# Patient Record
Sex: Female | Born: 1935 | Race: Black or African American | Hispanic: No | Smoking: Never smoker
Health system: Southern US, Community
[De-identification: ages and names within clinical notes are randomized; demographics above are authoritative.]

## PROBLEM LIST (undated history)

## (undated) DIAGNOSIS — R001 Bradycardia, unspecified: Secondary | ICD-10-CM

## (undated) DIAGNOSIS — M1990 Unspecified osteoarthritis, unspecified site: Secondary | ICD-10-CM

## (undated) DIAGNOSIS — D649 Anemia, unspecified: Secondary | ICD-10-CM

## (undated) DIAGNOSIS — F0390 Unspecified dementia without behavioral disturbance: Secondary | ICD-10-CM

## (undated) DIAGNOSIS — N390 Urinary tract infection, site not specified: Secondary | ICD-10-CM

## (undated) DIAGNOSIS — H409 Unspecified glaucoma: Secondary | ICD-10-CM

## (undated) DIAGNOSIS — Z8673 Personal history of transient ischemic attack (TIA), and cerebral infarction without residual deficits: Secondary | ICD-10-CM

## (undated) DIAGNOSIS — D591 Other autoimmune hemolytic anemias: Secondary | ICD-10-CM

## (undated) DIAGNOSIS — Z8781 Personal history of (healed) traumatic fracture: Secondary | ICD-10-CM

## (undated) DIAGNOSIS — E538 Deficiency of other specified B group vitamins: Secondary | ICD-10-CM

## (undated) HISTORY — PX: SPLENECTOMY, TOTAL: SHX788

---

## 1998-01-08 ENCOUNTER — Other Ambulatory Visit: Admission: RE | Admit: 1998-01-08 | Discharge: 1998-01-08 | Payer: Self-pay | Admitting: Internal Medicine

## 1998-06-02 ENCOUNTER — Other Ambulatory Visit: Admission: RE | Admit: 1998-06-02 | Discharge: 1998-06-02 | Payer: Self-pay | Admitting: Internal Medicine

## 1998-08-14 ENCOUNTER — Ambulatory Visit (HOSPITAL_COMMUNITY): Admission: RE | Admit: 1998-08-14 | Discharge: 1998-08-14 | Payer: Self-pay | Admitting: Gastroenterology

## 1999-08-12 ENCOUNTER — Encounter: Payer: Self-pay | Admitting: Emergency Medicine

## 1999-08-12 ENCOUNTER — Inpatient Hospital Stay (HOSPITAL_COMMUNITY): Admission: EM | Admit: 1999-08-12 | Discharge: 1999-08-14 | Payer: Self-pay | Admitting: Emergency Medicine

## 1999-08-14 ENCOUNTER — Encounter: Payer: Self-pay | Admitting: Internal Medicine

## 2000-03-08 ENCOUNTER — Encounter: Payer: Self-pay | Admitting: Internal Medicine

## 2000-03-08 ENCOUNTER — Ambulatory Visit (HOSPITAL_COMMUNITY): Admission: RE | Admit: 2000-03-08 | Discharge: 2000-03-08 | Payer: Self-pay | Admitting: Internal Medicine

## 2000-03-16 ENCOUNTER — Encounter: Admission: RE | Admit: 2000-03-16 | Discharge: 2000-03-16 | Payer: Self-pay | Admitting: Internal Medicine

## 2000-03-16 ENCOUNTER — Encounter: Payer: Self-pay | Admitting: Internal Medicine

## 2001-10-17 ENCOUNTER — Encounter: Payer: Self-pay | Admitting: Internal Medicine

## 2001-10-17 ENCOUNTER — Encounter: Admission: RE | Admit: 2001-10-17 | Discharge: 2001-10-17 | Payer: Self-pay | Admitting: Internal Medicine

## 2003-01-02 ENCOUNTER — Encounter: Admission: RE | Admit: 2003-01-02 | Discharge: 2003-01-02 | Payer: Self-pay | Admitting: Internal Medicine

## 2003-01-02 ENCOUNTER — Encounter: Payer: Self-pay | Admitting: Internal Medicine

## 2003-03-25 ENCOUNTER — Encounter: Payer: Self-pay | Admitting: Emergency Medicine

## 2003-03-25 ENCOUNTER — Emergency Department (HOSPITAL_COMMUNITY): Admission: EM | Admit: 2003-03-25 | Discharge: 2003-03-26 | Payer: Self-pay | Admitting: Emergency Medicine

## 2003-05-07 ENCOUNTER — Encounter: Admission: RE | Admit: 2003-05-07 | Discharge: 2003-06-19 | Payer: Self-pay | Admitting: Family Medicine

## 2004-07-09 ENCOUNTER — Ambulatory Visit: Payer: Self-pay | Admitting: Hematology & Oncology

## 2004-09-11 ENCOUNTER — Ambulatory Visit: Payer: Self-pay | Admitting: Hematology & Oncology

## 2004-11-12 ENCOUNTER — Ambulatory Visit: Payer: Self-pay | Admitting: Hematology & Oncology

## 2005-01-14 ENCOUNTER — Ambulatory Visit: Payer: Self-pay | Admitting: Hematology & Oncology

## 2005-03-12 ENCOUNTER — Ambulatory Visit: Payer: Self-pay | Admitting: Hematology & Oncology

## 2005-05-13 ENCOUNTER — Ambulatory Visit: Payer: Self-pay | Admitting: Hematology & Oncology

## 2005-06-25 ENCOUNTER — Ambulatory Visit (HOSPITAL_COMMUNITY): Admission: RE | Admit: 2005-06-25 | Discharge: 2005-06-25 | Payer: Self-pay | Admitting: Cardiology

## 2005-07-12 ENCOUNTER — Ambulatory Visit: Payer: Self-pay | Admitting: Hematology & Oncology

## 2005-08-03 ENCOUNTER — Encounter (HOSPITAL_COMMUNITY): Admission: RE | Admit: 2005-08-03 | Discharge: 2005-09-03 | Payer: Self-pay | Admitting: Hematology & Oncology

## 2005-09-13 ENCOUNTER — Ambulatory Visit: Payer: Self-pay | Admitting: Hematology & Oncology

## 2005-11-10 ENCOUNTER — Ambulatory Visit: Payer: Self-pay | Admitting: Hematology & Oncology

## 2005-12-23 LAB — CBC & DIFF AND RETIC
Basophils Absolute: 0 10*3/uL (ref 0.0–0.1)
Eosinophils Absolute: 0.2 10*3/uL (ref 0.0–0.5)
HCT: UNDETERMINED % (ref 34.8–46.6)
HGB: 9.4 g/dL — ABNORMAL LOW (ref 11.6–15.9)
LYMPH%: 20.8 % (ref 14.0–48.0)
MCV: UNDETERMINED fL (ref 81.0–101.0)
MONO%: 6.8 % (ref 0.0–13.0)
NEUT#: 3.8 10*3/uL (ref 1.5–6.5)
Platelets: 265 10*3/uL (ref 145–400)

## 2005-12-23 LAB — RETICULOCYTES (CHCC)
RBC.: 2.62 MIL/uL — ABNORMAL LOW (ref 3.87–5.11)
Retic Ct Pct: 5.1 % — ABNORMAL HIGH (ref 0.4–3.1)

## 2006-01-11 ENCOUNTER — Ambulatory Visit: Payer: Self-pay | Admitting: Hematology & Oncology

## 2006-01-13 LAB — CBC WITH DIFFERENTIAL/PLATELET
BASO%: 1.1 % (ref 0.0–2.0)
Basophils Absolute: 0 10*3/uL (ref 0.0–0.1)
EOS%: 4.2 % (ref 0.0–7.0)
HGB: 10.9 g/dL — ABNORMAL LOW (ref 11.6–15.9)
MCH: UNDETERMINED pg (ref 26.0–34.0)
MCHC: UNDETERMINED g/dL (ref 32.0–36.0)
MCV: UNDETERMINED fL (ref 81.0–101.0)
MONO%: 10 % (ref 0.0–13.0)
RDW: 11.9 % (ref 11.3–14.5)

## 2006-02-03 LAB — CBC WITH DIFFERENTIAL/PLATELET
Basophils Absolute: 0 10*3/uL (ref 0.0–0.1)
Eosinophils Absolute: 0.2 10*3/uL (ref 0.0–0.5)
HGB: 10.6 g/dL — ABNORMAL LOW (ref 11.6–15.9)
MCV: UNDETERMINED fL (ref 81.0–101.0)
NEUT#: 2.2 10*3/uL (ref 1.5–6.5)
RDW: UNDETERMINED % (ref 11.3–14.5)
lymph#: 1.4 10*3/uL (ref 0.9–3.3)

## 2006-02-22 ENCOUNTER — Ambulatory Visit: Payer: Self-pay | Admitting: Hematology & Oncology

## 2006-02-24 LAB — CBC & DIFF AND RETIC
Basophils Absolute: 0.3 10*3/uL — ABNORMAL HIGH (ref 0.0–0.1)
Eosinophils Absolute: 0.2 10*3/uL (ref 0.0–0.5)
HCT: UNDETERMINED % (ref 34.8–46.6)
LYMPH%: 20 % (ref 14.0–48.0)
MCV: UNDETERMINED fL (ref 81.0–101.0)
MONO#: 0.4 10*3/uL (ref 0.1–0.9)
MONO%: 5.2 % (ref 0.0–13.0)
NEUT#: 4.8 10*3/uL (ref 1.5–6.5)
NEUT%: 67.6 % (ref 39.6–76.8)
Platelets: 227 10*3/uL (ref 145–400)
WBC: 7.1 10*3/uL (ref 3.9–10.0)

## 2006-02-24 LAB — COMPREHENSIVE METABOLIC PANEL
AST: 21 U/L (ref 0–37)
Albumin: 3.8 g/dL (ref 3.5–5.2)
BUN: 30 mg/dL — ABNORMAL HIGH (ref 6–23)
CO2: 26 mEq/L (ref 19–32)
Calcium: 8.8 mg/dL (ref 8.4–10.5)
Chloride: 108 mEq/L (ref 96–112)
Creatinine, Ser: 1.26 mg/dL — ABNORMAL HIGH (ref 0.40–1.20)
Glucose, Bld: 166 mg/dL — ABNORMAL HIGH (ref 70–99)
Potassium: 4.1 mEq/L (ref 3.5–5.3)

## 2006-02-24 LAB — RETICULOCYTES (CHCC)
RBC.: 2.99 MIL/uL — ABNORMAL LOW (ref 3.87–5.11)
Retic Ct Pct: 2.2 % (ref 0.4–3.1)

## 2006-02-24 LAB — LACTATE DEHYDROGENASE: LDH: 375 U/L — ABNORMAL HIGH (ref 94–250)

## 2006-03-16 LAB — CBC WITH DIFFERENTIAL/PLATELET
Basophils Absolute: 0 10*3/uL (ref 0.0–0.1)
Eosinophils Absolute: 0.2 10*3/uL (ref 0.0–0.5)
HGB: 10.2 g/dL — ABNORMAL LOW (ref 11.6–15.9)
MCV: UNDETERMINED fL (ref 81.0–101.0)
MONO#: 0.6 10*3/uL (ref 0.1–0.9)
MONO%: 13.9 % — ABNORMAL HIGH (ref 0.0–13.0)
NEUT#: 2.4 10*3/uL (ref 1.5–6.5)
Platelets: 178 10*3/uL (ref 145–400)
RBC: UNDETERMINED 10*6/uL (ref 3.70–5.32)
RDW: UNDETERMINED % (ref 11.3–14.5)
WBC: 4.4 10*3/uL (ref 3.9–10.0)

## 2006-04-05 ENCOUNTER — Ambulatory Visit: Payer: Self-pay | Admitting: Hematology & Oncology

## 2006-04-07 LAB — CBC WITH DIFFERENTIAL/PLATELET
Basophils Absolute: 0.1 10*3/uL (ref 0.0–0.1)
Eosinophils Absolute: 0.1 10*3/uL (ref 0.0–0.5)
HGB: 10.7 g/dL — ABNORMAL LOW (ref 11.6–15.9)
MCV: UNDETERMINED fL (ref 81.0–101.0)
MONO#: 0.3 10*3/uL (ref 0.1–0.9)
MONO%: 6.5 % (ref 0.0–13.0)
NEUT#: 3 10*3/uL (ref 1.5–6.5)
RDW: 14.8 % — ABNORMAL HIGH (ref 11.3–14.5)
WBC: 4.8 10*3/uL (ref 3.9–10.0)
lymph#: 1.3 10*3/uL (ref 0.9–3.3)

## 2006-04-28 LAB — CBC WITH DIFFERENTIAL/PLATELET
Basophils Absolute: 0.1 10*3/uL (ref 0.0–0.1)
Eosinophils Absolute: 0.2 10*3/uL (ref 0.0–0.5)
HCT: UNDETERMINED % (ref 34.8–46.6)
HGB: 11.7 g/dL (ref 11.6–15.9)
LYMPH%: 34.5 % (ref 14.0–48.0)
MCV: UNDETERMINED fL (ref 81.0–101.0)
MONO#: 0.4 10*3/uL (ref 0.1–0.9)
MONO%: 11.5 % (ref 0.0–13.0)
NEUT#: 1.7 10*3/uL (ref 1.5–6.5)
NEUT%: 48 % (ref 39.6–76.8)
Platelets: 172 10*3/uL (ref 145–400)
WBC: 3.5 10*3/uL — ABNORMAL LOW (ref 3.9–10.0)

## 2006-05-19 LAB — CBC WITH DIFFERENTIAL/PLATELET
BASO%: 1.4 % (ref 0.0–2.0)
HCT: 1.4 % — ABNORMAL LOW (ref 34.8–46.6)
LYMPH%: 27.4 % (ref 14.0–48.0)
MCHC: 808 g/dL — ABNORMAL HIGH (ref 32.0–36.0)
MCV: 96.8 fL (ref 81.0–101.0)
MONO%: 9.5 % (ref 0.0–13.0)
NEUT%: 59.2 % (ref 39.6–76.8)
Platelets: 188 10*3/uL (ref 145–400)
RBC: 0.14 10*6/uL — ABNORMAL LOW (ref 3.70–5.32)

## 2006-06-07 ENCOUNTER — Ambulatory Visit: Payer: Self-pay | Admitting: Hematology & Oncology

## 2006-06-30 LAB — CBC WITH DIFFERENTIAL/PLATELET
Basophils Absolute: 0.1 10*3/uL (ref 0.0–0.1)
EOS%: 2 % (ref 0.0–7.0)
Eosinophils Absolute: 0.2 10*3/uL (ref 0.0–0.5)
HGB: 9.5 g/dL — ABNORMAL LOW (ref 11.6–15.9)
MCH: UNDETERMINED pg (ref 26.0–34.0)
NEUT#: 7.4 10*3/uL — ABNORMAL HIGH (ref 1.5–6.5)
RBC: UNDETERMINED 10*6/uL (ref 3.70–5.32)
RDW: UNDETERMINED % (ref 11.3–14.5)
lymph#: 1 10*3/uL (ref 0.9–3.3)

## 2006-07-19 ENCOUNTER — Ambulatory Visit: Payer: Self-pay | Admitting: Hematology & Oncology

## 2006-07-21 LAB — CBC WITH DIFFERENTIAL/PLATELET
BASO%: 1.4 % (ref 0.0–2.0)
EOS%: 3.9 % (ref 0.0–7.0)
MCH: UNDETERMINED pg (ref 26.0–34.0)
MCHC: UNDETERMINED g/dL (ref 32.0–36.0)
RDW: UNDETERMINED % (ref 11.3–14.5)
lymph#: 1 10*3/uL (ref 0.9–3.3)

## 2006-08-11 LAB — CBC WITH DIFFERENTIAL/PLATELET
Basophils Absolute: 0 10*3/uL (ref 0.0–0.1)
Eosinophils Absolute: 0.1 10*3/uL (ref 0.0–0.5)
HGB: 9.4 g/dL — ABNORMAL LOW (ref 11.6–15.9)
MCV: UNDETERMINED fL (ref 81.0–101.0)
MONO%: 3.9 % (ref 0.0–13.0)
NEUT#: 3.7 10*3/uL (ref 1.5–6.5)
RBC: UNDETERMINED 10*6/uL (ref 3.70–5.32)
RDW: UNDETERMINED % (ref 11.3–14.5)
WBC: 5 10*3/uL (ref 3.9–10.0)
lymph#: 1 10*3/uL (ref 0.9–3.3)

## 2006-08-11 LAB — CHCC SMEAR

## 2006-08-29 ENCOUNTER — Ambulatory Visit: Payer: Self-pay | Admitting: Hematology & Oncology

## 2006-09-01 ENCOUNTER — Encounter (HOSPITAL_COMMUNITY): Admission: RE | Admit: 2006-09-01 | Discharge: 2006-11-16 | Payer: Self-pay | Admitting: Hematology & Oncology

## 2006-09-01 LAB — CBC WITH DIFFERENTIAL/PLATELET
BASO%: 1.3 % (ref 0.0–2.0)
Eosinophils Absolute: 0.1 10*3/uL (ref 0.0–0.5)
HCT: UNDETERMINED % (ref 34.8–46.6)
LYMPH%: 17.8 % (ref 14.0–48.0)
MCHC: UNDETERMINED g/dL (ref 32.0–36.0)
MONO#: 0.5 10*3/uL (ref 0.1–0.9)
NEUT#: 4 10*3/uL (ref 1.5–6.5)
NEUT%: 70.1 % (ref 39.6–76.8)
Platelets: 323 10*3/uL (ref 145–400)
RBC: UNDETERMINED 10*6/uL (ref 3.70–5.32)
WBC: 5.7 10*3/uL (ref 3.9–10.0)
lymph#: 1 10*3/uL (ref 0.9–3.3)

## 2006-09-02 LAB — TYPE & CROSSMATCH - CHCC

## 2006-09-29 LAB — CBC WITH DIFFERENTIAL/PLATELET
Eosinophils Absolute: 0.2 10*3/uL (ref 0.0–0.5)
LYMPH%: 21.6 % (ref 14.0–48.0)
MONO#: 0.5 10*3/uL (ref 0.1–0.9)
NEUT#: 4.7 10*3/uL (ref 1.5–6.5)
Platelets: 271 10*3/uL (ref 145–400)
RBC: UNDETERMINED 10*6/uL (ref 3.70–5.32)
RDW: UNDETERMINED % (ref 11.3–14.5)
WBC: 7.1 10*3/uL (ref 3.9–10.0)
lymph#: 1.5 10*3/uL (ref 0.9–3.3)

## 2006-10-17 ENCOUNTER — Ambulatory Visit: Payer: Self-pay | Admitting: Hematology & Oncology

## 2006-10-20 LAB — CBC WITH DIFFERENTIAL/PLATELET
Eosinophils Absolute: 0.2 10*3/uL (ref 0.0–0.5)
HCT: UNDETERMINED % (ref 34.8–46.6)
HGB: 10.1 g/dL — ABNORMAL LOW (ref 11.6–15.9)
LYMPH%: 21.5 % (ref 14.0–48.0)
MONO#: 0.5 10*3/uL (ref 0.1–0.9)
NEUT#: 3.5 10*3/uL (ref 1.5–6.5)
NEUT%: 64.9 % (ref 39.6–76.8)
Platelets: 219 10*3/uL (ref 145–400)
WBC: 5.4 10*3/uL (ref 3.9–10.0)

## 2006-11-09 LAB — CBC WITH DIFFERENTIAL/PLATELET
BASO%: 1.5 % (ref 0.0–2.0)
Basophils Absolute: 0.1 10*3/uL (ref 0.0–0.1)
EOS%: 3.4 % (ref 0.0–7.0)
Eosinophils Absolute: 0.2 10*3/uL (ref 0.0–0.5)
HCT: UNDETERMINED % (ref 34.8–46.6)
HGB: 10.8 g/dL — ABNORMAL LOW (ref 11.6–15.9)
LYMPH%: 22.9 % (ref 14.0–48.0)
MCH: UNDETERMINED pg (ref 26.0–34.0)
MCHC: UNDETERMINED g/dL (ref 32.0–36.0)
MCV: UNDETERMINED fL (ref 81.0–101.0)
MONO#: 0.5 10*3/uL (ref 0.1–0.9)
MONO%: 10.9 % (ref 0.0–13.0)
NEUT#: 3 10*3/uL (ref 1.5–6.5)
NEUT%: 61.3 % (ref 39.6–76.8)
Platelets: 236 10*3/uL (ref 145–400)
RBC: UNDETERMINED 10*6/uL (ref 3.70–5.32)
RDW: UNDETERMINED % (ref 11.3–14.5)
WBC: 5 10*3/uL (ref 3.9–10.0)
lymph#: 1.1 10*3/uL (ref 0.9–3.3)

## 2006-11-29 ENCOUNTER — Ambulatory Visit: Payer: Self-pay | Admitting: Hematology & Oncology

## 2006-12-01 LAB — CBC WITH DIFFERENTIAL/PLATELET
BASO%: 1.2 % (ref 0.0–2.0)
Basophils Absolute: 0.1 10*3/uL (ref 0.0–0.1)
EOS%: 3.4 % (ref 0.0–7.0)
HCT: UNDETERMINED % (ref 34.8–46.6)
MCH: UNDETERMINED pg (ref 26.0–34.0)
MCHC: UNDETERMINED g/dL (ref 32.0–36.0)
MCV: UNDETERMINED fL (ref 81.0–101.0)
MONO%: 10.6 % (ref 0.0–13.0)
NEUT%: 57 % (ref 39.6–76.8)
lymph#: 1.6 10*3/uL (ref 0.9–3.3)

## 2006-12-22 ENCOUNTER — Ambulatory Visit: Payer: Self-pay | Admitting: Hematology & Oncology

## 2006-12-22 LAB — CBC WITH DIFFERENTIAL/PLATELET
BASO%: 1.5 % (ref 0.0–2.0)
Eosinophils Absolute: 0.2 10*3/uL (ref 0.0–0.5)
LYMPH%: 27.5 % (ref 14.0–48.0)
MCHC: UNDETERMINED g/dL (ref 32.0–36.0)
MONO#: 0.6 10*3/uL (ref 0.1–0.9)
NEUT#: 3.2 10*3/uL (ref 1.5–6.5)
Platelets: 223 10*3/uL (ref 145–400)
RBC: UNDETERMINED 10*6/uL (ref 3.70–5.32)
RDW: 15.2 % — ABNORMAL HIGH (ref 11.3–14.5)
WBC: 5.5 10*3/uL (ref 3.9–10.0)
lymph#: 1.5 10*3/uL (ref 0.9–3.3)

## 2007-01-10 ENCOUNTER — Ambulatory Visit: Payer: Self-pay | Admitting: Hematology & Oncology

## 2007-01-12 ENCOUNTER — Ambulatory Visit: Payer: Self-pay | Admitting: Hematology & Oncology

## 2007-01-12 LAB — CBC WITH DIFFERENTIAL/PLATELET
Eosinophils Absolute: 0.2 10*3/uL (ref 0.0–0.5)
HCT: UNDETERMINED % (ref 34.8–46.6)
HGB: 10.5 g/dL — ABNORMAL LOW (ref 11.6–15.9)
LYMPH%: 23 % (ref 14.0–48.0)
MONO#: 0.5 10*3/uL (ref 0.1–0.9)
NEUT#: 2.5 10*3/uL (ref 1.5–6.5)
NEUT%: 60 % (ref 39.6–76.8)
Platelets: 231 10*3/uL (ref 145–400)
WBC: 4.2 10*3/uL (ref 3.9–10.0)
lymph#: 1 10*3/uL (ref 0.9–3.3)

## 2007-02-02 LAB — CBC WITH DIFFERENTIAL/PLATELET
BASO%: 1.5 % (ref 0.0–2.0)
Basophils Absolute: 0.1 10*3/uL (ref 0.0–0.1)
EOS%: 4.3 % (ref 0.0–7.0)
HCT: UNDETERMINED % (ref 34.8–46.6)
HGB: 10.8 g/dL — ABNORMAL LOW (ref 11.6–15.9)
LYMPH%: 29.5 % (ref 14.0–48.0)
MCH: UNDETERMINED pg (ref 26.0–34.0)
MCHC: UNDETERMINED g/dL (ref 32.0–36.0)
MONO#: 0.6 10*3/uL (ref 0.1–0.9)
NEUT%: 53.2 % (ref 39.6–76.8)
Platelets: 225 10*3/uL (ref 145–400)
lymph#: 1.6 10*3/uL (ref 0.9–3.3)

## 2007-02-23 ENCOUNTER — Ambulatory Visit: Payer: Self-pay | Admitting: Hematology & Oncology

## 2007-02-23 LAB — CBC & DIFF AND RETIC
BASO%: 1.7 % (ref 0.0–2.0)
Basophils Absolute: 0.1 10*3/uL (ref 0.0–0.1)
EOS%: 4.4 % (ref 0.0–7.0)
HCT: UNDETERMINED % (ref 34.8–46.6)
HGB: 10.7 g/dL — ABNORMAL LOW (ref 11.6–15.9)
MCH: UNDETERMINED pg (ref 26.0–34.0)
MCHC: UNDETERMINED g/dL (ref 32.0–36.0)
MCV: UNDETERMINED fL (ref 81.0–101.0)
MONO%: 10.2 % (ref 0.0–13.0)
NEUT%: 60.7 % (ref 39.6–76.8)
lymph#: 1.4 10*3/uL (ref 0.9–3.3)

## 2007-02-23 LAB — FERRITIN: Ferritin: 591 ng/mL — ABNORMAL HIGH (ref 10–291)

## 2007-03-12 ENCOUNTER — Emergency Department (HOSPITAL_COMMUNITY): Admission: EM | Admit: 2007-03-12 | Discharge: 2007-03-12 | Payer: Self-pay | Admitting: Emergency Medicine

## 2007-03-16 ENCOUNTER — Ambulatory Visit: Payer: Self-pay | Admitting: Hematology & Oncology

## 2007-03-16 LAB — TECHNOLOGIST REVIEW

## 2007-03-16 LAB — CBC WITH DIFFERENTIAL/PLATELET
BASO%: 1.7 % (ref 0.0–2.0)
EOS%: 3.9 % (ref 0.0–7.0)
HCT: UNDETERMINED % (ref 34.8–46.6)
LYMPH%: 16.4 % (ref 14.0–48.0)
MCH: UNDETERMINED pg (ref 26.0–34.0)
MCHC: UNDETERMINED g/dL (ref 32.0–36.0)
NEUT%: 66.1 % (ref 39.6–76.8)
Platelets: 260 10*3/uL (ref 145–400)
RBC: UNDETERMINED 10*6/uL (ref 3.70–5.32)
lymph#: 0.9 10*3/uL (ref 0.9–3.3)

## 2007-04-03 ENCOUNTER — Ambulatory Visit: Payer: Self-pay | Admitting: Hematology & Oncology

## 2007-04-06 ENCOUNTER — Ambulatory Visit: Payer: Self-pay | Admitting: Hematology & Oncology

## 2007-04-27 LAB — CBC WITH DIFFERENTIAL/PLATELET
BASO%: 0.7 % (ref 0.0–2.0)
EOS%: 3.5 % (ref 0.0–7.0)
MCH: UNDETERMINED pg (ref 26.0–34.0)
MCV: UNDETERMINED fL (ref 81.0–101.0)
MONO%: 8.4 % (ref 0.0–13.0)
RBC: UNDETERMINED 10*6/uL (ref 3.70–5.32)
RDW: UNDETERMINED % (ref 11.3–14.5)

## 2007-05-18 ENCOUNTER — Ambulatory Visit: Payer: Self-pay | Admitting: Hematology & Oncology

## 2007-05-18 LAB — CBC & DIFF AND RETIC
Eosinophils Absolute: 0.2 10*3/uL (ref 0.0–0.5)
MCV: UNDETERMINED fL (ref 81.0–101.0)
MONO#: 0.5 10*3/uL (ref 0.1–0.9)
MONO%: 12.2 % (ref 0.0–13.0)
NEUT#: 2.3 10*3/uL (ref 1.5–6.5)
RBC: UNDETERMINED 10*6/uL (ref 3.70–5.32)
RDW: 15.9 % — ABNORMAL HIGH (ref 11.3–14.5)
RETIC #: 10.6 10*3/uL — ABNORMAL LOW (ref 19.7–115.1)
Retic %: 2.1 % (ref 0.4–2.3)
WBC: 4.4 10*3/uL (ref 3.9–10.0)
lymph#: 1.4 10*3/uL (ref 0.9–3.3)

## 2007-06-05 ENCOUNTER — Ambulatory Visit: Payer: Self-pay | Admitting: Hematology & Oncology

## 2007-06-12 LAB — CBC WITH DIFFERENTIAL/PLATELET
Basophils Absolute: 0.1 10*3/uL (ref 0.0–0.1)
EOS%: 2.4 % (ref 0.0–7.0)
Eosinophils Absolute: 0.1 10*3/uL (ref 0.0–0.5)
HGB: 9.4 g/dL — ABNORMAL LOW (ref 11.6–15.9)
LYMPH%: 22.8 % (ref 14.0–48.0)
MCH: UNDETERMINED pg (ref 26.0–34.0)
MCV: UNDETERMINED fL (ref 81.0–101.0)
MONO%: 8.7 % (ref 0.0–13.0)
NEUT#: 3.4 10*3/uL (ref 1.5–6.5)
NEUT%: 65.1 % (ref 39.6–76.8)
Platelets: 237 10*3/uL (ref 145–400)

## 2007-06-29 LAB — CBC WITH DIFFERENTIAL/PLATELET
EOS%: 2.9 % (ref 0.0–7.0)
Eosinophils Absolute: 0.1 10*3/uL (ref 0.0–0.5)
LYMPH%: 22.9 % (ref 14.0–48.0)
MCH: UNDETERMINED pg (ref 26.0–34.0)
MCHC: UNDETERMINED g/dL (ref 32.0–36.0)
MCV: UNDETERMINED fL (ref 81.0–101.0)
MONO%: 11 % (ref 0.0–13.0)
Platelets: 213 10*3/uL (ref 145–400)
RBC: UNDETERMINED 10*6/uL (ref 3.70–5.32)
RDW: 17.6 % — ABNORMAL HIGH (ref 11.3–14.5)

## 2007-07-18 ENCOUNTER — Ambulatory Visit: Payer: Self-pay | Admitting: Hematology & Oncology

## 2007-07-20 LAB — CBC WITH DIFFERENTIAL/PLATELET
BASO%: 1.2 % (ref 0.0–2.0)
LYMPH%: 27.7 % (ref 14.0–48.0)
MCHC: UNDETERMINED g/dL (ref 32.0–36.0)
MONO#: 0.5 10*3/uL (ref 0.1–0.9)
MONO%: 8 % (ref 0.0–13.0)
NEUT#: 3.8 10*3/uL (ref 1.5–6.5)
Platelets: 278 10*3/uL (ref 145–400)
RBC: UNDETERMINED 10*6/uL (ref 3.70–5.32)
RDW: 15.1 % — ABNORMAL HIGH (ref 11.3–14.5)
WBC: 6.3 10*3/uL (ref 3.9–10.0)

## 2007-07-21 ENCOUNTER — Encounter (HOSPITAL_COMMUNITY): Admission: RE | Admit: 2007-07-21 | Discharge: 2007-09-06 | Payer: Self-pay | Admitting: Hematology & Oncology

## 2007-07-25 LAB — TYPE & CROSSMATCH - CHCC

## 2007-08-10 LAB — CBC WITH DIFFERENTIAL/PLATELET
Basophils Absolute: 0.1 10*3/uL (ref 0.0–0.1)
EOS%: 3.7 % (ref 0.0–7.0)
HCT: UNDETERMINED % (ref 34.8–46.6)
HGB: 11.8 g/dL (ref 11.6–15.9)
LYMPH%: 27.4 % (ref 14.0–48.0)
MCH: UNDETERMINED pg (ref 26.0–34.0)
MCV: UNDETERMINED fL (ref 81.0–101.0)
MONO%: 11.1 % (ref 0.0–13.0)
NEUT%: 56.4 % (ref 39.6–76.8)
RDW: 19.2 % — ABNORMAL HIGH (ref 11.3–14.5)

## 2007-08-28 ENCOUNTER — Ambulatory Visit: Payer: Self-pay | Admitting: Hematology & Oncology

## 2007-09-01 LAB — CBC WITH DIFFERENTIAL/PLATELET
EOS%: 2.1 % (ref 0.0–7.0)
MCH: UNDETERMINED pg (ref 26.0–34.0)
MCHC: UNDETERMINED g/dL (ref 32.0–36.0)
MCV: UNDETERMINED fL (ref 81.0–101.0)
MONO%: 9.2 % (ref 0.0–13.0)
RBC: UNDETERMINED 10*6/uL (ref 3.70–5.32)
RDW: UNDETERMINED % (ref 11.3–14.5)

## 2007-09-21 LAB — CBC WITH DIFFERENTIAL/PLATELET
BASO%: 1.2 % (ref 0.0–2.0)
Eosinophils Absolute: 0.2 10*3/uL (ref 0.0–0.5)
MONO#: 0.6 10*3/uL (ref 0.1–0.9)
MONO%: 11.8 % (ref 0.0–13.0)
NEUT#: 3 10*3/uL (ref 1.5–6.5)
RBC: UNDETERMINED 10*6/uL (ref 3.70–5.32)
RDW: UNDETERMINED % (ref 11.3–14.5)
WBC: 5.1 10*3/uL (ref 3.9–10.0)

## 2007-10-10 ENCOUNTER — Ambulatory Visit: Payer: Self-pay | Admitting: Hematology & Oncology

## 2007-10-12 LAB — CBC WITH DIFFERENTIAL/PLATELET
BASO%: 1.9 % (ref 0.0–2.0)
Basophils Absolute: 0.1 10*3/uL (ref 0.0–0.1)
EOS%: 3.1 % (ref 0.0–7.0)
HCT: UNDETERMINED % (ref 34.8–46.6)
MCH: UNDETERMINED pg (ref 26.0–34.0)
MCHC: UNDETERMINED g/dL (ref 32.0–36.0)
MCV: 94.2 fL (ref 81.0–101.0)
MONO%: 12.2 % (ref 0.0–13.0)
NEUT%: 58.1 % (ref 39.6–76.8)
lymph#: 1.2 10*3/uL (ref 0.9–3.3)

## 2007-11-02 LAB — CBC WITH DIFFERENTIAL/PLATELET
BASO%: 1.1 % (ref 0.0–2.0)
Basophils Absolute: 0.1 10*3/uL (ref 0.0–0.1)
EOS%: 4.2 % (ref 0.0–7.0)
HGB: 9.8 g/dL — ABNORMAL LOW (ref 11.6–15.9)
MCH: UNDETERMINED pg (ref 26.0–34.0)
MCHC: UNDETERMINED g/dL (ref 32.0–36.0)
MCV: UNDETERMINED fL (ref 81.0–101.0)
MONO%: 10.2 % (ref 0.0–13.0)
RBC: UNDETERMINED 10*6/uL (ref 3.70–5.32)
RDW: UNDETERMINED % (ref 11.3–14.5)
lymph#: 1.3 10*3/uL (ref 0.9–3.3)

## 2007-11-21 ENCOUNTER — Ambulatory Visit: Payer: Self-pay | Admitting: Hematology & Oncology

## 2007-11-23 LAB — CBC & DIFF AND RETIC
Basophils Absolute: 0.1 10*3/uL (ref 0.0–0.1)
Eosinophils Absolute: 0.1 10*3/uL (ref 0.0–0.5)
HGB: 10.5 g/dL — ABNORMAL LOW (ref 11.6–15.9)
MCV: UNDETERMINED fL (ref 81.0–101.0)
MONO#: 0.6 10*3/uL (ref 0.1–0.9)
MONO%: 10 % (ref 0.0–13.0)
NEUT#: 3.7 10*3/uL (ref 1.5–6.5)
RDW: 14.4 % (ref 11.3–14.5)
WBC: 5.5 10*3/uL (ref 3.9–10.0)
lymph#: 1.1 10*3/uL (ref 0.9–3.3)

## 2007-11-23 LAB — FERRITIN: Ferritin: 748 ng/mL — ABNORMAL HIGH (ref 10–291)

## 2007-12-14 LAB — CBC WITH DIFFERENTIAL/PLATELET
BASO%: 1 % (ref 0.0–2.0)
MCHC: UNDETERMINED g/dL (ref 32.0–36.0)
MONO#: 0.5 10*3/uL (ref 0.1–0.9)
RBC: UNDETERMINED 10*6/uL (ref 3.70–5.32)
WBC: 4.9 10*3/uL (ref 3.9–10.0)
lymph#: 1.1 10*3/uL (ref 0.9–3.3)

## 2008-01-01 ENCOUNTER — Ambulatory Visit: Payer: Self-pay | Admitting: Hematology & Oncology

## 2008-01-04 LAB — CBC WITH DIFFERENTIAL/PLATELET
BASO%: 1.1 % (ref 0.0–2.0)
Basophils Absolute: 0.1 10*3/uL (ref 0.0–0.1)
HCT: UNDETERMINED % (ref 34.8–46.6)
HGB: 8.9 g/dL — ABNORMAL LOW (ref 11.6–15.9)
MCHC: UNDETERMINED g/dL (ref 32.0–36.0)
MONO#: 0.7 10*3/uL (ref 0.1–0.9)
NEUT%: 69.1 % (ref 39.6–76.8)
WBC: 6.6 10*3/uL (ref 3.9–10.0)
lymph#: 1 10*3/uL (ref 0.9–3.3)

## 2008-01-25 ENCOUNTER — Encounter (HOSPITAL_COMMUNITY): Admission: RE | Admit: 2008-01-25 | Discharge: 2008-04-15 | Payer: Self-pay | Admitting: Hematology & Oncology

## 2008-02-20 ENCOUNTER — Ambulatory Visit: Payer: Self-pay | Admitting: Hematology & Oncology

## 2008-02-22 LAB — CBC WITH DIFFERENTIAL/PLATELET
BASO%: 1 % (ref 0.0–2.0)
Basophils Absolute: 0.1 10*3/uL (ref 0.0–0.1)
HCT: UNDETERMINED % (ref 34.8–46.6)
HGB: 11.8 g/dL (ref 11.6–15.9)
MCHC: UNDETERMINED g/dL (ref 32.0–36.0)
MONO#: 0.6 10*3/uL (ref 0.1–0.9)
NEUT%: 61 % (ref 39.6–76.8)
WBC: 5.6 10*3/uL (ref 3.9–10.0)
lymph#: 1.3 10*3/uL (ref 0.9–3.3)

## 2008-02-22 LAB — CHCC SMEAR

## 2008-03-07 LAB — CBC WITH DIFFERENTIAL/PLATELET
BASO%: 0.6 % (ref 0.0–2.0)
EOS%: 2.6 % (ref 0.0–7.0)
Eosinophils Absolute: 0.2 10*3/uL (ref 0.0–0.5)
MCH: UNDETERMINED pg (ref 26.0–34.0)
MCV: UNDETERMINED fL (ref 81.0–101.0)
MONO%: 5.7 % (ref 0.0–13.0)
NEUT#: 4.6 10*3/uL (ref 1.5–6.5)
RBC: UNDETERMINED 10*6/uL (ref 3.70–5.32)
RDW: UNDETERMINED % (ref 11.3–14.5)

## 2008-03-25 ENCOUNTER — Ambulatory Visit: Payer: Self-pay | Admitting: Hematology

## 2008-03-28 LAB — CBC WITH DIFFERENTIAL/PLATELET
Eosinophils Absolute: 0.1 10*3/uL (ref 0.0–0.5)
MONO#: 0.5 10*3/uL (ref 0.1–0.9)
NEUT#: 2.5 10*3/uL (ref 1.5–6.5)
RBC: UNDETERMINED 10*6/uL (ref 3.70–5.32)
RDW: 15.4 % — ABNORMAL HIGH (ref 11.3–14.5)
WBC: 4.5 10*3/uL (ref 3.9–10.0)

## 2008-04-15 LAB — CBC WITH DIFFERENTIAL/PLATELET
Basophils Absolute: 0.1 10*3/uL (ref 0.0–0.1)
EOS%: 2.8 % (ref 0.0–7.0)
HGB: 11.3 g/dL — ABNORMAL LOW (ref 11.6–15.9)
MCH: UNDETERMINED pg (ref 26.0–34.0)
MCV: UNDETERMINED fL (ref 81.0–101.0)
MONO%: 7.3 % (ref 0.0–13.0)
RDW: 18 % — ABNORMAL HIGH (ref 11.3–14.5)

## 2008-05-15 ENCOUNTER — Ambulatory Visit: Payer: Self-pay | Admitting: Hematology & Oncology

## 2008-05-16 LAB — CBC WITH DIFFERENTIAL (CANCER CENTER ONLY)
BASO#: 0 10*3/uL (ref 0.0–0.2)
EOS%: 5.2 % (ref 0.0–7.0)
Eosinophils Absolute: 0.2 10*3/uL (ref 0.0–0.5)
HCT: 26 % — ABNORMAL LOW (ref 34.8–46.6)
HGB: 9.2 g/dL — ABNORMAL LOW (ref 11.6–15.9)
LYMPH%: 30.3 % (ref 14.0–48.0)
MCH: 33.2 pg (ref 26.0–34.0)
MCHC: 35.4 g/dL (ref 32.0–36.0)
MCV: 94 fL (ref 81–101)
MONO%: 5.2 % (ref 0.0–13.0)
NEUT#: 2.5 10*3/uL (ref 1.5–6.5)
NEUT%: 58.9 % (ref 39.6–80.0)
RBC: 2.77 10*6/uL — ABNORMAL LOW (ref 3.70–5.32)

## 2008-06-13 ENCOUNTER — Ambulatory Visit: Payer: Self-pay | Admitting: Hematology

## 2008-06-13 LAB — CBC WITH DIFFERENTIAL/PLATELET
BASO%: 1 % (ref 0.0–2.0)
Eosinophils Absolute: 0.1 10*3/uL (ref 0.0–0.5)
LYMPH%: 21.7 % (ref 14.0–48.0)
MCHC: UNDETERMINED g/dL (ref 32.0–36.0)
MCV: UNDETERMINED fL (ref 81.0–101.0)
MONO#: 0.5 10*3/uL (ref 0.1–0.9)
MONO%: 10.3 % (ref 0.0–13.0)
NEUT#: 3.3 10*3/uL (ref 1.5–6.5)
RBC: UNDETERMINED 10*6/uL (ref 3.70–5.32)
RDW: 14.5 % (ref 11.3–14.5)
WBC: 5.2 10*3/uL (ref 3.9–10.0)

## 2008-06-13 LAB — HOLD TUBE, BLOOD BANK

## 2008-07-17 ENCOUNTER — Ambulatory Visit: Payer: Self-pay | Admitting: Hematology & Oncology

## 2008-07-18 LAB — CBC WITH DIFFERENTIAL (CANCER CENTER ONLY)
BASO#: 0 10*3/uL (ref 0.0–0.2)
EOS%: 5.9 % (ref 0.0–7.0)
Eosinophils Absolute: 0.2 10*3/uL (ref 0.0–0.5)
HCT: 25 % — ABNORMAL LOW (ref 34.8–46.6)
HGB: 8.9 g/dL — ABNORMAL LOW (ref 11.6–15.9)
LYMPH%: 28.2 % (ref 14.0–48.0)
MCH: 35.1 pg — ABNORMAL HIGH (ref 26.0–34.0)
MCHC: 35.7 g/dL (ref 32.0–36.0)
MCV: 98 fL (ref 81–101)
MONO%: 7.7 % (ref 0.0–13.0)
NEUT%: 57.8 % (ref 39.6–80.0)
RBC: 2.55 10*6/uL — ABNORMAL LOW (ref 3.70–5.32)

## 2008-07-23 LAB — RETICULOCYTES (CHCC)
ABS Retic: 67.2 10*3/uL (ref 19.0–186.0)
RBC.: 2.49 MIL/uL — ABNORMAL LOW (ref 3.87–5.11)

## 2008-07-23 LAB — FERRITIN: Ferritin: 832 ng/mL — ABNORMAL HIGH (ref 10–291)

## 2008-07-23 LAB — LACTATE DEHYDROGENASE: LDH: 407 U/L — ABNORMAL HIGH (ref 94–250)

## 2008-08-06 ENCOUNTER — Ambulatory Visit: Payer: Self-pay | Admitting: Hematology

## 2008-08-12 LAB — CBC WITH DIFFERENTIAL/PLATELET
Basophils Absolute: 0.1 10*3/uL (ref 0.0–0.1)
Eosinophils Absolute: 0.2 10*3/uL (ref 0.0–0.5)
HGB: 10.9 g/dL — ABNORMAL LOW (ref 11.6–15.9)
NEUT#: 2.9 10*3/uL (ref 1.5–6.5)
RDW: 13.1 % (ref 11.3–14.5)
lymph#: 1.1 10*3/uL (ref 0.9–3.3)

## 2008-08-29 LAB — CBC WITH DIFFERENTIAL/PLATELET
Basophils Absolute: 0.1 10*3/uL (ref 0.0–0.1)
Eosinophils Absolute: 0.2 10*3/uL (ref 0.0–0.5)
HCT: UNDETERMINED % (ref 34.8–46.6)
HGB: 10.4 g/dL — ABNORMAL LOW (ref 11.6–15.9)
LYMPH%: 26.1 % (ref 14.0–48.0)
MCV: UNDETERMINED fL (ref 81.0–101.0)
MONO%: 12.6 % (ref 0.0–13.0)
NEUT#: 2.5 10*3/uL (ref 1.5–6.5)
Platelets: 215 10*3/uL (ref 145–400)

## 2008-08-29 LAB — TECHNOLOGIST REVIEW

## 2008-09-18 ENCOUNTER — Ambulatory Visit: Payer: Self-pay | Admitting: Hematology & Oncology

## 2008-10-02 LAB — CBC WITH DIFFERENTIAL (CANCER CENTER ONLY)
BASO#: 0 10*3/uL (ref 0.0–0.2)
EOS%: 3.9 % (ref 0.0–7.0)
Eosinophils Absolute: 0.2 10*3/uL (ref 0.0–0.5)
HCT: 23.1 % — ABNORMAL LOW (ref 34.8–46.6)
HGB: 8.1 g/dL — ABNORMAL LOW (ref 11.6–15.9)
LYMPH#: 1.4 10*3/uL (ref 0.9–3.3)
MCHC: 35.1 g/dL (ref 32.0–36.0)
NEUT%: 56.6 % (ref 39.6–80.0)
RBC: 2.36 10*6/uL — ABNORMAL LOW (ref 3.70–5.32)

## 2008-10-07 LAB — RETICULOCYTES (CHCC)

## 2008-10-23 LAB — CBC WITH DIFFERENTIAL (CANCER CENTER ONLY)
BASO#: 0 10*3/uL (ref 0.0–0.2)
BASO%: 0.5 % (ref 0.0–2.0)
HCT: 31.6 % — ABNORMAL LOW (ref 34.8–46.6)
HGB: 10.6 g/dL — ABNORMAL LOW (ref 11.6–15.9)
LYMPH#: 0.9 10*3/uL (ref 0.9–3.3)
MONO#: 0.2 10*3/uL (ref 0.1–0.9)
NEUT#: 2.8 10*3/uL (ref 1.5–6.5)
NEUT%: 68.5 % (ref 39.6–80.0)
RDW: 10.4 % — ABNORMAL LOW (ref 10.5–14.6)
WBC: 4.2 10*3/uL (ref 3.9–10.0)

## 2008-10-23 LAB — RETICULOCYTES (CHCC)
ABS Retic: 69.1 10*3/uL (ref 19.0–186.0)
RBC.: 3.14 MIL/uL — ABNORMAL LOW (ref 3.87–5.11)

## 2008-10-24 ENCOUNTER — Inpatient Hospital Stay (HOSPITAL_COMMUNITY): Admission: EM | Admit: 2008-10-24 | Discharge: 2008-10-25 | Payer: Self-pay | Admitting: Emergency Medicine

## 2008-11-11 ENCOUNTER — Ambulatory Visit: Payer: Self-pay | Admitting: Hematology

## 2008-11-13 ENCOUNTER — Encounter (HOSPITAL_COMMUNITY): Admission: RE | Admit: 2008-11-13 | Discharge: 2009-01-09 | Payer: Self-pay | Admitting: Hematology & Oncology

## 2008-11-13 LAB — CBC WITH DIFFERENTIAL/PLATELET
BASO%: 0.4 % (ref 0.0–2.0)
Eosinophils Absolute: 0.2 10*3/uL (ref 0.0–0.5)
HCT: UNDETERMINED % (ref 34.8–46.6)
LYMPH%: 31.6 % (ref 14.0–49.7)
MCHC: UNDETERMINED g/dL (ref 31.5–36.0)
MCV: UNDETERMINED fL (ref 79.5–101.0)
MONO#: 0.5 10*3/uL (ref 0.1–0.9)
MONO%: 10.2 % (ref 0.0–14.0)
NEUT%: 54.5 % (ref 38.4–76.8)
Platelets: 236 10*3/uL (ref 145–400)
RBC: UNDETERMINED 10*6/uL (ref 3.70–5.45)
nRBC: 0 % (ref 0–0)

## 2008-12-04 LAB — CBC WITH DIFFERENTIAL/PLATELET
Basophils Absolute: 0 10*3/uL (ref 0.0–0.1)
EOS%: 5 % (ref 0.0–7.0)
Eosinophils Absolute: 0.3 10*3/uL (ref 0.0–0.5)
HGB: 9.8 g/dL — ABNORMAL LOW (ref 11.6–15.9)
LYMPH%: 22.1 % (ref 14.0–49.7)
MCH: 34 pg (ref 25.1–34.0)
MCV: 100.3 fL (ref 79.5–101.0)
MONO%: 13.7 % (ref 0.0–14.0)
NEUT#: 3.1 10*3/uL (ref 1.5–6.5)
Platelets: 207 10*3/uL (ref 145–400)
RDW: 14.7 % — ABNORMAL HIGH (ref 11.2–14.5)

## 2008-12-24 ENCOUNTER — Ambulatory Visit: Payer: Self-pay | Admitting: Hematology & Oncology

## 2008-12-25 LAB — CBC WITH DIFFERENTIAL (CANCER CENTER ONLY)
BASO%: 0.3 % (ref 0.0–2.0)
LYMPH#: 1.3 10*3/uL (ref 0.9–3.3)
MONO#: 0.3 10*3/uL (ref 0.1–0.9)
Platelets: 210 10*3/uL (ref 145–400)
RDW: 11.4 % (ref 10.5–14.6)
WBC: 4.1 10*3/uL (ref 3.9–10.0)

## 2008-12-27 LAB — RETICULOCYTES (CHCC)

## 2009-01-13 ENCOUNTER — Ambulatory Visit: Payer: Self-pay | Admitting: Hematology

## 2009-01-15 ENCOUNTER — Encounter (HOSPITAL_COMMUNITY): Admission: RE | Admit: 2009-01-15 | Discharge: 2009-03-05 | Payer: Self-pay | Admitting: Oncology

## 2009-01-15 LAB — CBC WITH DIFFERENTIAL/PLATELET
EOS%: 3.2 % (ref 0.0–7.0)
MCH: UNDETERMINED pg (ref 25.1–34.0)
MCV: UNDETERMINED fL (ref 79.5–101.0)
MONO%: 9.6 % (ref 0.0–14.0)
NEUT#: 3.4 10*3/uL (ref 1.5–6.5)
RBC: UNDETERMINED 10*6/uL (ref 3.70–5.45)
RDW: UNDETERMINED % (ref 11.2–14.5)

## 2009-01-16 LAB — TYPE & CROSSMATCH - CHCC

## 2009-02-04 LAB — CHCC SATELLITE - SMEAR

## 2009-02-04 LAB — CBC WITH DIFFERENTIAL (CANCER CENTER ONLY)
BASO%: 0.2 % (ref 0.0–2.0)
EOS%: 4.3 % (ref 0.0–7.0)
LYMPH%: 28.9 % (ref 14.0–48.0)
MCH: 34.6 pg — ABNORMAL HIGH (ref 26.0–34.0)
MCV: 100 fL (ref 81–101)
MONO%: 5.9 % (ref 0.0–13.0)
Platelets: 245 10*3/uL (ref 145–400)
RDW: 12.6 % (ref 10.5–14.6)
WBC: 4.1 10*3/uL (ref 3.9–10.0)

## 2009-02-09 LAB — RETICULOCYTES (CHCC)

## 2009-02-09 LAB — FERRITIN: Ferritin: 1270 ng/mL — ABNORMAL HIGH (ref 10–291)

## 2009-03-05 ENCOUNTER — Ambulatory Visit: Payer: Self-pay | Admitting: Hematology

## 2009-03-11 ENCOUNTER — Encounter (HOSPITAL_COMMUNITY): Admission: RE | Admit: 2009-03-11 | Discharge: 2009-06-05 | Payer: Self-pay | Admitting: Hematology & Oncology

## 2009-03-11 LAB — CBC WITH DIFFERENTIAL/PLATELET
Basophils Absolute: 0 10*3/uL (ref 0.0–0.1)
Eosinophils Absolute: 0.2 10*3/uL (ref 0.0–0.5)
HGB: 8.9 g/dL — ABNORMAL LOW (ref 11.6–15.9)
MONO#: 0.5 10*3/uL (ref 0.1–0.9)
NEUT#: 3.7 10*3/uL (ref 1.5–6.5)
RBC: UNDETERMINED 10*6/uL (ref 3.70–5.45)
RDW: UNDETERMINED % (ref 11.2–14.5)
WBC: 5.8 10*3/uL (ref 3.9–10.3)
nRBC: 0 % (ref 0–0)

## 2009-03-12 LAB — TYPE & CROSSMATCH - CHCC

## 2009-04-14 ENCOUNTER — Ambulatory Visit: Payer: Self-pay | Admitting: Hematology & Oncology

## 2009-04-21 LAB — CBC WITH DIFFERENTIAL (CANCER CENTER ONLY)
HCT: 26.9 % — ABNORMAL LOW (ref 34.8–46.6)
HGB: 9.6 g/dL — ABNORMAL LOW (ref 11.6–15.9)
MCH: 32.9 pg (ref 26.0–34.0)
MCHC: 35.7 g/dL (ref 32.0–36.0)
MCV: 92 fL (ref 81–101)
RDW: 12.4 % (ref 10.5–14.6)

## 2009-04-21 LAB — MANUAL DIFFERENTIAL (CHCC SATELLITE)
ALC: 1.1 10*3/uL (ref 0.6–2.2)
BASO: 2 % (ref 0–2)
Eos: 5 % (ref 0–7)
MONO: 3 % (ref 0–13)
PLT EST ~~LOC~~: ADEQUATE
SEG: 70 % (ref 40–75)

## 2009-04-23 LAB — RETICULOCYTES (CHCC)

## 2009-05-08 ENCOUNTER — Ambulatory Visit: Payer: Self-pay | Admitting: Hematology

## 2009-05-13 LAB — CBC WITH DIFFERENTIAL/PLATELET
Basophils Absolute: 0.2 10*3/uL — ABNORMAL HIGH (ref 0.0–0.1)
HCT: UNDETERMINED % (ref 34.8–46.6)
HGB: 10.2 g/dL — ABNORMAL LOW (ref 11.6–15.9)
LYMPH%: 31 % (ref 14.0–49.7)
MCH: UNDETERMINED pg (ref 25.1–34.0)
MONO#: 0.6 10*3/uL (ref 0.1–0.9)
NEUT%: 50.8 % (ref 38.4–76.8)
Platelets: 178 10*3/uL (ref 145–400)
WBC: 5.2 10*3/uL (ref 3.9–10.3)
lymph#: 1.6 10*3/uL (ref 0.9–3.3)

## 2009-06-05 ENCOUNTER — Ambulatory Visit: Payer: Self-pay | Admitting: Hematology & Oncology

## 2009-06-06 LAB — CBC WITH DIFFERENTIAL (CANCER CENTER ONLY)
BASO#: 0 10*3/uL (ref 0.0–0.2)
Eosinophils Absolute: 0.2 10*3/uL (ref 0.0–0.5)
HCT: 31.3 % — ABNORMAL LOW (ref 34.8–46.6)
HGB: 10.9 g/dL — ABNORMAL LOW (ref 11.6–15.9)
LYMPH#: 1.2 10*3/uL (ref 0.9–3.3)
LYMPH%: 27.3 % (ref 14.0–48.0)
MCV: 99 fL (ref 81–101)
MONO#: 0.3 10*3/uL (ref 0.1–0.9)
NEUT%: 61.1 % (ref 39.6–80.0)
RBC: 3.17 10*6/uL — ABNORMAL LOW (ref 3.70–5.32)
RDW: 11.2 % (ref 10.5–14.6)
WBC: 4.3 10*3/uL (ref 3.9–10.0)

## 2009-06-06 LAB — HOLD TUBE, BLOOD BANK - CHCC SATELLITE

## 2009-06-06 LAB — CHCC SATELLITE - SMEAR

## 2009-06-09 LAB — RETICULOCYTES (CHCC)

## 2009-06-20 ENCOUNTER — Ambulatory Visit: Payer: Self-pay | Admitting: Hematology

## 2009-06-26 ENCOUNTER — Encounter (HOSPITAL_COMMUNITY): Admission: RE | Admit: 2009-06-26 | Discharge: 2009-09-04 | Payer: Self-pay | Admitting: Hematology & Oncology

## 2009-06-26 LAB — CBC WITH DIFFERENTIAL/PLATELET
Basophils Absolute: 0 10*3/uL (ref 0.0–0.1)
Eosinophils Absolute: 0.2 10*3/uL (ref 0.0–0.5)
HCT: UNDETERMINED % (ref 34.8–46.6)
HGB: 7.2 g/dL — ABNORMAL LOW (ref 11.6–15.9)
MCH: UNDETERMINED pg (ref 25.1–34.0)
MCV: UNDETERMINED fL (ref 79.5–101.0)
MONO%: 10.5 % (ref 0.0–14.0)
NEUT#: 3 10*3/uL (ref 1.5–6.5)
NEUT%: 61.6 % (ref 38.4–76.8)
RDW: UNDETERMINED % (ref 11.2–14.5)

## 2009-06-30 LAB — TYPE & CROSSMATCH - CHCC

## 2009-07-14 ENCOUNTER — Ambulatory Visit: Payer: Self-pay | Admitting: Hematology & Oncology

## 2009-07-18 LAB — CBC WITH DIFFERENTIAL (CANCER CENTER ONLY)
Eosinophils Absolute: 0.2 10*3/uL (ref 0.0–0.5)
HCT: 30.2 % — ABNORMAL LOW (ref 34.8–46.6)
LYMPH#: 1.3 10*3/uL (ref 0.9–3.3)
LYMPH%: 29 % (ref 14.0–48.0)
MCV: 95 fL (ref 81–101)
MONO#: 0.4 10*3/uL (ref 0.1–0.9)
Platelets: 238 10*3/uL (ref 145–400)
RBC: 3.16 10*6/uL — ABNORMAL LOW (ref 3.70–5.32)
WBC: 4.4 10*3/uL (ref 3.9–10.0)

## 2009-07-23 LAB — COLD AGGLUTININ TITER: Cold Agglutinin Titer: 1:20480 {titer} — AB

## 2009-07-23 LAB — RETICULOCYTES (CHCC)

## 2009-08-13 ENCOUNTER — Ambulatory Visit: Payer: Self-pay | Admitting: Hematology

## 2009-08-15 LAB — CBC WITH DIFFERENTIAL/PLATELET
BASO%: 0.7 % (ref 0.0–2.0)
EOS%: 6.4 % (ref 0.0–7.0)
Eosinophils Absolute: 0.3 10*3/uL (ref 0.0–0.5)
LYMPH%: 29.9 % (ref 14.0–49.7)
MCH: UNDETERMINED pg (ref 25.1–34.0)
MCHC: UNDETERMINED g/dL (ref 31.5–36.0)
MCV: UNDETERMINED fL (ref 79.5–101.0)
MONO%: 8.4 % (ref 0.0–14.0)
NEUT#: 2.4 10*3/uL (ref 1.5–6.5)
Platelets: 188 10*3/uL (ref 145–400)
RBC: UNDETERMINED 10*6/uL (ref 3.70–5.45)
RDW: UNDETERMINED % (ref 11.2–14.5)
nRBC: 0 % (ref 0–0)

## 2009-08-15 LAB — HOLD TUBE, BLOOD BANK

## 2009-08-18 LAB — RETICULOCYTES (CHCC)

## 2009-09-17 ENCOUNTER — Ambulatory Visit: Payer: Self-pay | Admitting: Hematology

## 2009-09-19 ENCOUNTER — Encounter (HOSPITAL_COMMUNITY): Admission: RE | Admit: 2009-09-19 | Discharge: 2009-12-18 | Payer: Self-pay | Admitting: Hematology & Oncology

## 2009-09-19 LAB — CBC & DIFF AND RETIC
BASO%: 0.5 % (ref 0.0–2.0)
Basophils Absolute: 0 10*3/uL (ref 0.0–0.1)
HCT: UNDETERMINED % (ref 34.8–46.6)
HGB: 8.3 g/dL — ABNORMAL LOW (ref 11.6–15.9)
LYMPH%: 20.7 % (ref 14.0–49.7)
MCHC: UNDETERMINED g/dL (ref 31.5–36.0)
MONO#: 0.6 10*3/uL (ref 0.1–0.9)
NEUT%: 64.6 % (ref 38.4–76.8)
Platelets: 194 10*3/uL (ref 145–400)
WBC: 5.6 10*3/uL (ref 3.9–10.3)

## 2009-09-22 LAB — RETICULOCYTES (CHCC)

## 2009-09-22 LAB — TYPE & CROSSMATCH - CHCC

## 2009-10-21 ENCOUNTER — Ambulatory Visit: Payer: Self-pay | Admitting: Hematology & Oncology

## 2009-10-23 LAB — CBC WITH DIFFERENTIAL (CANCER CENTER ONLY)
BASO#: 0 10*3/uL (ref 0.0–0.2)
EOS%: 6 % (ref 0.0–7.0)
Eosinophils Absolute: 0.3 10*3/uL (ref 0.0–0.5)
HGB: 8.1 g/dL — ABNORMAL LOW (ref 11.6–15.9)
LYMPH#: 0.8 10*3/uL — ABNORMAL LOW (ref 0.9–3.3)
NEUT#: 3.1 10*3/uL (ref 1.5–6.5)
RBC: 2.36 10*6/uL — ABNORMAL LOW (ref 3.70–5.32)
WBC: 4.5 10*3/uL (ref 3.9–10.0)

## 2009-10-24 ENCOUNTER — Ambulatory Visit: Payer: Self-pay | Admitting: Oncology

## 2009-10-24 LAB — TYPE & CROSSMATCH - CHCC SATELLITE

## 2009-11-13 LAB — MANUAL DIFFERENTIAL (CHCC SATELLITE)
ALC: 1.4 10*3/uL (ref 0.6–2.2)
SEG: 60 % (ref 40–75)

## 2009-11-13 LAB — CBC WITH DIFFERENTIAL (CANCER CENTER ONLY)
MCV: 97 fL (ref 81–101)
Platelets: ADEQUATE 10*3/uL (ref 145–400)
RBC: 3.58 10*6/uL — ABNORMAL LOW (ref 3.70–5.32)
WBC: 5.5 10*3/uL (ref 3.9–10.0)

## 2009-11-13 LAB — HOLD TUBE, BLOOD BANK - CHCC SATELLITE

## 2009-11-13 LAB — CHCC SATELLITE - SMEAR

## 2009-12-01 ENCOUNTER — Ambulatory Visit: Payer: Self-pay | Admitting: Oncology

## 2009-12-16 ENCOUNTER — Ambulatory Visit: Payer: Self-pay | Admitting: Hematology & Oncology

## 2009-12-17 LAB — CBC WITH DIFFERENTIAL (CANCER CENTER ONLY)
BASO#: 0 10*3/uL (ref 0.0–0.2)
BASO%: 0.2 % (ref 0.0–2.0)
EOS%: 8.8 % — ABNORMAL HIGH (ref 0.0–7.0)
HGB: 8.1 g/dL — ABNORMAL LOW (ref 11.6–15.9)
LYMPH#: 1.3 10*3/uL (ref 0.9–3.3)
MCHC: 34.8 g/dL (ref 32.0–36.0)
MONO#: 0.3 10*3/uL (ref 0.1–0.9)
NEUT#: 2.5 10*3/uL (ref 1.5–6.5)
WBC: 4.5 10*3/uL (ref 3.9–10.0)

## 2010-01-07 LAB — MANUAL DIFFERENTIAL (CHCC SATELLITE)
ALC: 1.4 10*3/uL (ref 0.6–2.2)
ANC (CHCC HP manual diff): 3 10*3/uL (ref 1.5–6.7)
LYMPH: 25 % (ref 14–48)
SEG: 52 % (ref 40–75)

## 2010-01-07 LAB — CHCC SATELLITE - SMEAR

## 2010-01-07 LAB — CBC WITH DIFFERENTIAL (CANCER CENTER ONLY)
HGB: 10 g/dL — ABNORMAL LOW (ref 11.6–15.9)
MCHC: 35 g/dL (ref 32.0–36.0)
RBC: 2.86 10*6/uL — ABNORMAL LOW (ref 3.70–5.32)
WBC: 5.8 10*3/uL (ref 3.9–10.0)

## 2010-01-07 LAB — FERRITIN: Ferritin: 1126 ng/mL — ABNORMAL HIGH (ref 10–291)

## 2010-02-16 ENCOUNTER — Ambulatory Visit: Payer: Self-pay | Admitting: Hematology & Oncology

## 2010-02-18 LAB — CBC WITH DIFFERENTIAL (CANCER CENTER ONLY)
BASO%: 0.3 % (ref 0.0–2.0)
Eosinophils Absolute: 0.4 10*3/uL (ref 0.0–0.5)
LYMPH#: 1.4 10*3/uL (ref 0.9–3.3)
MONO#: 0.4 10*3/uL (ref 0.1–0.9)
NEUT#: 2.4 10*3/uL (ref 1.5–6.5)
Platelets: 198 10*3/uL (ref 145–400)
RBC: 2.62 10*6/uL — ABNORMAL LOW (ref 3.70–5.32)
RDW: 10 % — ABNORMAL LOW (ref 10.5–14.6)
WBC: 4.5 10*3/uL (ref 3.9–10.0)

## 2010-02-18 LAB — CHCC SATELLITE - SMEAR

## 2010-03-18 ENCOUNTER — Ambulatory Visit: Payer: Self-pay | Admitting: Hematology & Oncology

## 2010-04-08 LAB — CBC WITH DIFFERENTIAL (CANCER CENTER ONLY)
BASO%: 0.3 % (ref 0.0–2.0)
Eosinophils Absolute: 0.3 10*3/uL (ref 0.0–0.5)
LYMPH%: 30.4 % (ref 14.0–48.0)
MCV: 98 fL (ref 81–101)
MONO#: 0.3 10*3/uL (ref 0.1–0.9)
NEUT#: 1.9 10*3/uL (ref 1.5–6.5)
Platelets: 187 10*3/uL (ref 145–400)
RBC: 2.5 10*6/uL — ABNORMAL LOW (ref 3.70–5.32)
RDW: 10.8 % (ref 10.5–14.6)
WBC: 3.6 10*3/uL — ABNORMAL LOW (ref 3.9–10.0)

## 2010-04-08 LAB — FERRITIN: Ferritin: 1621 ng/mL — ABNORMAL HIGH (ref 10–291)

## 2010-04-08 LAB — HAPTOGLOBIN: Haptoglobin: <6 mg/dL — ABNORMAL LOW (ref 16–200)

## 2010-04-27 ENCOUNTER — Ambulatory Visit: Payer: Self-pay | Admitting: Oncology

## 2010-05-01 LAB — CBC WITH DIFFERENTIAL/PLATELET
EOS%: 6.7 % (ref 0.0–7.0)
Eosinophils Absolute: 0.3 10*3/uL (ref 0.0–0.5)
HGB: 11.3 g/dL — ABNORMAL LOW (ref 11.6–15.9)
MCH: UNDETERMINED pg (ref 25.1–34.0)
MCV: UNDETERMINED fL (ref 79.5–101.0)
MONO%: 11.8 % (ref 0.0–14.0)
NEUT#: 2.4 10*3/uL (ref 1.5–6.5)
RBC: UNDETERMINED 10*6/uL (ref 3.70–5.45)
RDW: UNDETERMINED % (ref 11.2–14.5)
lymph#: 1 10*3/uL (ref 0.9–3.3)
nRBC: 0 % (ref 0–0)

## 2010-05-21 ENCOUNTER — Ambulatory Visit: Payer: Self-pay | Admitting: Hematology & Oncology

## 2010-05-22 ENCOUNTER — Encounter (HOSPITAL_COMMUNITY): Admission: RE | Admit: 2010-05-22 | Discharge: 2010-05-22 | Payer: Self-pay | Admitting: Hematology & Oncology

## 2010-05-22 LAB — CHCC SATELLITE - SMEAR

## 2010-05-22 LAB — CBC WITH DIFFERENTIAL (CANCER CENTER ONLY)
BASO%: 0.5 % (ref 0.0–2.0)
Eosinophils Absolute: 0.3 10*3/uL (ref 0.0–0.5)
LYMPH#: 1.3 10*3/uL (ref 0.9–3.3)
MCV: 97 fL (ref 81–101)
MONO#: 0.5 10*3/uL (ref 0.1–0.9)
NEUT#: 3.6 10*3/uL (ref 1.5–6.5)
Platelets: 216 10*3/uL (ref 145–400)
RBC: 2.44 10*6/uL — ABNORMAL LOW (ref 3.70–5.32)
RDW: 11.7 % (ref 10.5–14.6)
WBC: 5.7 10*3/uL (ref 3.9–10.0)

## 2010-05-22 LAB — FERRITIN: Ferritin: 1489 ng/mL — ABNORMAL HIGH (ref 10–291)

## 2010-05-26 LAB — TYPE & CROSSMATCH - CHCC SATELLITE

## 2010-05-31 ENCOUNTER — Inpatient Hospital Stay (HOSPITAL_COMMUNITY): Admission: EM | Admit: 2010-05-31 | Discharge: 2010-06-15 | Payer: Self-pay | Admitting: Emergency Medicine

## 2010-06-01 ENCOUNTER — Encounter (INDEPENDENT_AMBULATORY_CARE_PROVIDER_SITE_OTHER): Payer: Self-pay | Admitting: Internal Medicine

## 2010-07-07 ENCOUNTER — Ambulatory Visit: Payer: Self-pay | Admitting: Hematology & Oncology

## 2010-07-07 LAB — CBC WITH DIFFERENTIAL (CANCER CENTER ONLY)
Eosinophils Absolute: 0.2 10*3/uL (ref 0.0–0.5)
HCT: 29.1 % — ABNORMAL LOW (ref 34.8–46.6)
LYMPH%: 31.1 % (ref 14.0–48.0)
MCV: 96 fL (ref 81–101)
MONO#: 0.4 10*3/uL (ref 0.1–0.9)
Platelets: 208 10*3/uL (ref 145–400)
RBC: 3.02 10*6/uL — ABNORMAL LOW (ref 3.70–5.32)
WBC: 4.5 10*3/uL (ref 3.9–10.0)

## 2010-07-07 LAB — CHCC SATELLITE - SMEAR

## 2010-07-07 LAB — RETICULOCYTES (CHCC)

## 2010-07-29 ENCOUNTER — Encounter (HOSPITAL_COMMUNITY)
Admission: RE | Admit: 2010-07-29 | Discharge: 2010-09-04 | Payer: Self-pay | Source: Home / Self Care | Attending: Hematology & Oncology | Admitting: Hematology & Oncology

## 2010-07-31 ENCOUNTER — Ambulatory Visit: Payer: Self-pay | Admitting: Oncology

## 2010-07-31 LAB — TYPE & CROSSMATCH - CHCC SATELLITE

## 2010-11-11 ENCOUNTER — Encounter (HOSPITAL_COMMUNITY)
Admission: RE | Admit: 2010-11-11 | Discharge: 2010-11-11 | Disposition: A | Discharge status: Home / Self Care | Payer: PRIVATE HEALTH INSURANCE | Source: Ambulatory Visit | Attending: Hematology & Oncology | Admitting: Hematology & Oncology

## 2010-11-11 ENCOUNTER — Other Ambulatory Visit: Payer: Self-pay | Admitting: Family

## 2010-11-11 ENCOUNTER — Encounter (HOSPITAL_BASED_OUTPATIENT_CLINIC_OR_DEPARTMENT_OTHER): Payer: PRIVATE HEALTH INSURANCE | Admitting: Oncology

## 2010-11-11 ENCOUNTER — Other Ambulatory Visit: Payer: Self-pay | Admitting: Hematology & Oncology

## 2010-11-11 DIAGNOSIS — N039 Chronic nephritic syndrome with unspecified morphologic changes: Secondary | ICD-10-CM

## 2010-11-11 DIAGNOSIS — D649 Anemia, unspecified: Secondary | ICD-10-CM | POA: Insufficient documentation

## 2010-11-11 DIAGNOSIS — N189 Chronic kidney disease, unspecified: Secondary | ICD-10-CM

## 2010-11-11 DIAGNOSIS — D631 Anemia in chronic kidney disease: Secondary | ICD-10-CM

## 2010-11-11 DIAGNOSIS — D591 Autoimmune hemolytic anemia, unspecified: Secondary | ICD-10-CM

## 2010-11-11 LAB — CBC WITH DIFFERENTIAL/PLATELET
BASO%: 0 % (ref 0.0–2.0)
Basophils Absolute: 0 10*3/uL (ref 0.0–0.1)
EOS%: 1.1 % (ref 0.0–7.0)
HGB: 7.4 g/dL — ABNORMAL LOW (ref 11.6–15.9)
MCH: UNDETERMINED pg (ref 25.1–34.0)
MCHC: UNDETERMINED g/dL (ref 31.5–36.0)
RBC: UNDETERMINED 10*6/uL (ref 3.70–5.45)
RDW: UNDETERMINED % (ref 11.2–14.5)
lymph#: 0.9 10*3/uL (ref 0.9–3.3)

## 2010-11-13 ENCOUNTER — Encounter (HOSPITAL_BASED_OUTPATIENT_CLINIC_OR_DEPARTMENT_OTHER): Payer: PRIVATE HEALTH INSURANCE | Admitting: Oncology

## 2010-11-13 DIAGNOSIS — D591 Autoimmune hemolytic anemia, unspecified: Secondary | ICD-10-CM

## 2010-11-13 DIAGNOSIS — D631 Anemia in chronic kidney disease: Secondary | ICD-10-CM

## 2010-11-13 DIAGNOSIS — N039 Chronic nephritic syndrome with unspecified morphologic changes: Secondary | ICD-10-CM

## 2010-11-13 DIAGNOSIS — N189 Chronic kidney disease, unspecified: Secondary | ICD-10-CM

## 2010-11-14 LAB — CROSSMATCH
ABO/RH(D): B POS
Antibody Screen: NEGATIVE
Unit division: 0

## 2010-11-17 LAB — CROSSMATCH

## 2010-11-19 LAB — BASIC METABOLIC PANEL
BUN: 26 mg/dL — ABNORMAL HIGH (ref 6–23)
BUN: 29 mg/dL — ABNORMAL HIGH (ref 6–23)
BUN: 29 mg/dL — ABNORMAL HIGH (ref 6–23)
BUN: 35 mg/dL — ABNORMAL HIGH (ref 6–23)
BUN: 37 mg/dL — ABNORMAL HIGH (ref 6–23)
CO2: 24 mEq/L (ref 19–32)
CO2: 25 mEq/L (ref 19–32)
CO2: 26 mEq/L (ref 19–32)
CO2: 28 mEq/L (ref 19–32)
CO2: 29 mEq/L (ref 19–32)
CO2: 30 mEq/L (ref 19–32)
Calcium: 8.3 mg/dL — ABNORMAL LOW (ref 8.4–10.5)
Calcium: 8.4 mg/dL (ref 8.4–10.5)
Calcium: 8.5 mg/dL (ref 8.4–10.5)
Calcium: 8.5 mg/dL (ref 8.4–10.5)
Calcium: 8.7 mg/dL (ref 8.4–10.5)
Calcium: 8.8 mg/dL (ref 8.4–10.5)
Chloride: 108 mEq/L (ref 96–112)
Chloride: 110 mEq/L (ref 96–112)
Chloride: 111 mEq/L (ref 96–112)
Creatinine, Ser: 0.97 mg/dL (ref 0.4–1.2)
Creatinine, Ser: 1.04 mg/dL (ref 0.4–1.2)
Creatinine, Ser: 1.04 mg/dL (ref 0.4–1.2)
Creatinine, Ser: 1.19 mg/dL (ref 0.4–1.2)
GFR calc Af Amer: 56 mL/min — ABNORMAL LOW (ref >60)
GFR calc Af Amer: 59 mL/min — ABNORMAL LOW (ref >60)
GFR calc Af Amer: 60 mL/min — ABNORMAL LOW (ref >60)
GFR calc Af Amer: >60 mL/min (ref >60)
GFR calc non Af Amer: 46 mL/min — ABNORMAL LOW (ref >60)
GFR calc non Af Amer: 49 mL/min — ABNORMAL LOW (ref >60)
GFR calc non Af Amer: 49 mL/min — ABNORMAL LOW (ref >60)
GFR calc non Af Amer: 49 mL/min — ABNORMAL LOW (ref >60)
GFR calc non Af Amer: 54 mL/min — ABNORMAL LOW (ref >60)
Glucose, Bld: 107 mg/dL — ABNORMAL HIGH (ref 70–99)
Glucose, Bld: 111 mg/dL — ABNORMAL HIGH (ref 70–99)
Glucose, Bld: 115 mg/dL — ABNORMAL HIGH (ref 70–99)
Glucose, Bld: 121 mg/dL — ABNORMAL HIGH (ref 70–99)
Glucose, Bld: 132 mg/dL — ABNORMAL HIGH (ref 70–99)
Glucose, Bld: 149 mg/dL — ABNORMAL HIGH (ref 70–99)
Potassium: 4.2 mEq/L (ref 3.5–5.1)
Potassium: 5.5 mEq/L — ABNORMAL HIGH (ref 3.5–5.1)
Potassium: 9.4 mEq/L (ref 3.5–5.1)
Sodium: 142 mEq/L (ref 135–145)
Sodium: 143 mEq/L (ref 135–145)
Sodium: 143 mEq/L (ref 135–145)
Sodium: 144 mEq/L (ref 135–145)

## 2010-11-19 LAB — CBC
HCT: 20.7 % — ABNORMAL LOW (ref 36.0–46.0)
HCT: 25.7 % — ABNORMAL LOW (ref 36.0–46.0)
HCT: 27.1 % — ABNORMAL LOW (ref 36.0–46.0)
HCT: 27.5 % — ABNORMAL LOW (ref 36.0–46.0)
Hemoglobin: 10.2 g/dL — ABNORMAL LOW (ref 12.0–15.0)
Hemoglobin: 7.1 g/dL — ABNORMAL LOW (ref 12.0–15.0)
Hemoglobin: 7.3 g/dL — ABNORMAL LOW (ref 12.0–15.0)
Hemoglobin: 8 g/dL — ABNORMAL LOW (ref 12.0–15.0)
Hemoglobin: 8.4 g/dL — ABNORMAL LOW (ref 12.0–15.0)
Hemoglobin: 9.2 g/dL — ABNORMAL LOW (ref 12.0–15.0)
Hemoglobin: 9.3 g/dL — ABNORMAL LOW (ref 12.0–15.0)
MCH: 31.3 pg (ref 26.0–34.0)
MCH: 31.9 pg (ref 26.0–34.0)
MCH: 32.6 pg (ref 26.0–34.0)
MCH: 34.1 pg — ABNORMAL HIGH (ref 26.0–34.0)
MCH: 36.2 pg — ABNORMAL HIGH (ref 26.0–34.0)
MCHC: 33.1 g/dL (ref 30.0–36.0)
MCHC: 33.2 g/dL (ref 30.0–36.0)
MCHC: 34.2 g/dL (ref 30.0–36.0)
MCHC: 35.9 g/dL (ref 30.0–36.0)
MCV: 102.2 fL — ABNORMAL HIGH (ref 78.0–100.0)
MCV: 94.7 fL (ref 78.0–100.0)
MCV: 95.4 fL (ref 78.0–100.0)
Platelets: 127 10*3/uL — ABNORMAL LOW (ref 150–400)
Platelets: 187 10*3/uL (ref 150–400)
Platelets: 261 10*3/uL (ref 150–400)
Platelets: 298 10*3/uL (ref 150–400)
Platelets: 363 10*3/uL (ref 150–400)
Platelets: 381 10*3/uL (ref 150–400)
RBC: 2.15 MIL/uL — ABNORMAL LOW (ref 3.87–5.11)
RBC: 2.17 MIL/uL — ABNORMAL LOW (ref 3.87–5.11)
RBC: 2.51 MIL/uL — ABNORMAL LOW (ref 3.87–5.11)
RBC: 2.7 MIL/uL — ABNORMAL LOW (ref 3.87–5.11)
RBC: 2.85 MIL/uL — ABNORMAL LOW (ref 3.87–5.11)
RDW: 17.6 % — ABNORMAL HIGH (ref 11.5–15.5)
RDW: 17.8 % — ABNORMAL HIGH (ref 11.5–15.5)
RDW: 19.1 % — ABNORMAL HIGH (ref 11.5–15.5)
RDW: 22 % — ABNORMAL HIGH (ref 11.5–15.5)
WBC: 10.4 10*3/uL (ref 4.0–10.5)
WBC: 5.8 10*3/uL (ref 4.0–10.5)
WBC: 6.3 10*3/uL (ref 4.0–10.5)
WBC: 7 10*3/uL (ref 4.0–10.5)
WBC: 7.4 10*3/uL (ref 4.0–10.5)
WBC: 8.8 10*3/uL (ref 4.0–10.5)

## 2010-11-19 LAB — COMPREHENSIVE METABOLIC PANEL
ALT: 27 U/L (ref 0–35)
ALT: 28 U/L (ref 0–35)
AST: 40 U/L — ABNORMAL HIGH (ref 0–37)
AST: 53 U/L — ABNORMAL HIGH (ref 0–37)
Albumin: 2.5 g/dL — ABNORMAL LOW (ref 3.5–5.2)
Alkaline Phosphatase: 100 U/L (ref 39–117)
Alkaline Phosphatase: 113 U/L (ref 39–117)
BUN: 33 mg/dL — ABNORMAL HIGH (ref 6–23)
BUN: 35 mg/dL — ABNORMAL HIGH (ref 6–23)
CO2: 30 mEq/L (ref 19–32)
CO2: 30 mEq/L (ref 19–32)
CO2: 30 mEq/L (ref 19–32)
Calcium: 8.7 mg/dL (ref 8.4–10.5)
Calcium: 8.8 mg/dL (ref 8.4–10.5)
Chloride: 108 mEq/L (ref 96–112)
Chloride: 109 mEq/L (ref 96–112)
Creatinine, Ser: 0.98 mg/dL (ref 0.4–1.2)
Creatinine, Ser: 0.99 mg/dL (ref 0.4–1.2)
GFR calc Af Amer: >60 mL/min (ref >60)
GFR calc Af Amer: >60 mL/min (ref >60)
GFR calc Af Amer: >60 mL/min (ref >60)
GFR calc non Af Amer: 52 mL/min — ABNORMAL LOW (ref >60)
GFR calc non Af Amer: 52 mL/min — ABNORMAL LOW (ref >60)
GFR calc non Af Amer: 53 mL/min — ABNORMAL LOW (ref >60)
Glucose, Bld: 115 mg/dL — ABNORMAL HIGH (ref 70–99)
Glucose, Bld: 137 mg/dL — ABNORMAL HIGH (ref 70–99)
Glucose, Bld: 158 mg/dL — ABNORMAL HIGH (ref 70–99)
Potassium: 3.3 mEq/L — ABNORMAL LOW (ref 3.5–5.1)
Potassium: 6.5 mEq/L (ref 3.5–5.1)
Sodium: 142 mEq/L (ref 135–145)
Sodium: 146 mEq/L — ABNORMAL HIGH (ref 135–145)
Total Bilirubin: 4.9 mg/dL — ABNORMAL HIGH (ref 0.3–1.2)
Total Bilirubin: 6.3 mg/dL — ABNORMAL HIGH (ref 0.3–1.2)
Total Protein: 6 g/dL (ref 6.0–8.3)

## 2010-11-19 LAB — URINALYSIS, ROUTINE W REFLEX MICROSCOPIC
Bilirubin Urine: NEGATIVE
Bilirubin Urine: NEGATIVE
Glucose, UA: NEGATIVE mg/dL
Ketones, ur: NEGATIVE mg/dL
Ketones, ur: NEGATIVE mg/dL
Ketones, ur: NEGATIVE mg/dL
Nitrite: NEGATIVE
Nitrite: NEGATIVE
Protein, ur: 100 mg/dL — AB
Protein, ur: NEGATIVE mg/dL
Protein, ur: NEGATIVE mg/dL
Specific Gravity, Urine: 1.016 (ref 1.005–1.030)
Urobilinogen, UA: 4 mg/dL — ABNORMAL HIGH (ref 0.0–1.0)
Urobilinogen, UA: >8 mg/dL — ABNORMAL HIGH (ref 0.0–1.0)
pH: 5.5 (ref 5.0–8.0)
pH: 7.5 (ref 5.0–8.0)

## 2010-11-19 LAB — CROSSMATCH
ABO/RH(D): B POS
ABO/RH(D): B POS
Antibody Screen: NEGATIVE

## 2010-11-19 LAB — URINE CULTURE
Culture  Setup Time: 201110040827
Special Requests: NEGATIVE

## 2010-11-19 LAB — HEMOGLOBIN: Hemoglobin: 8.1 g/dL — ABNORMAL LOW (ref 12.0–15.0)

## 2010-11-19 LAB — RETICULOCYTES
RBC.: 3.2 MIL/uL — ABNORMAL LOW (ref 3.87–5.11)
Retic Ct Pct: 2.5 % (ref 0.4–3.1)

## 2010-11-19 LAB — SAMPLE TO BLOOD BANK

## 2010-11-19 LAB — URINE MICROSCOPIC-ADD ON

## 2010-11-19 LAB — APTT: aPTT: 31 seconds (ref 24–37)

## 2010-11-19 LAB — PROTIME-INR: Prothrombin Time: 13.9 seconds (ref 11.6–15.2)

## 2010-11-22 LAB — CROSSMATCH

## 2010-11-25 LAB — CROSSMATCH
ABO/RH(D): B POS
Antibody Screen: NEGATIVE

## 2010-12-06 IMAGING — CT CT CERVICAL SPINE W/O CM
4 series · 16 of 33 positions shown, 19 images · non-contrast
Comparison: Plain films earlier today.

CLINICAL DATA: Fall.  Head laceration.  Left hip and shoulder
pain.Neck pain.

CT CERVICAL SPINE WITHOUT CONTRAST
TECHNIQUE: Multidetector CT imaging of the cervical spine was
performed. Multiplanar CT image reconstructions were also
generated.

[Series 3: c_spine 2.0 b31s · axial · 0.23mm/px · z∈[-258,-152]mm · 5 of 81 slices shown, 7 images]
[im 14/81  soft-tissue]
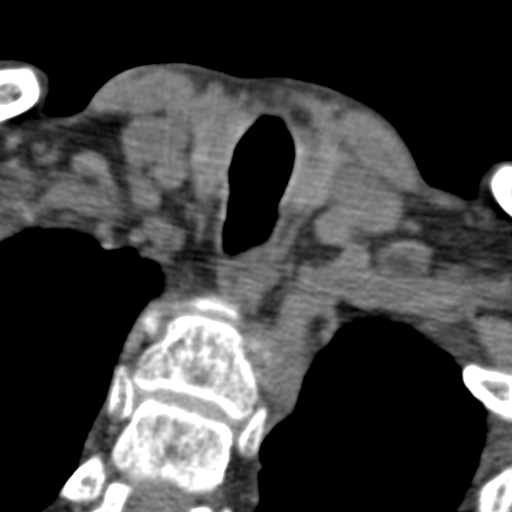
[im 14/81  bone]
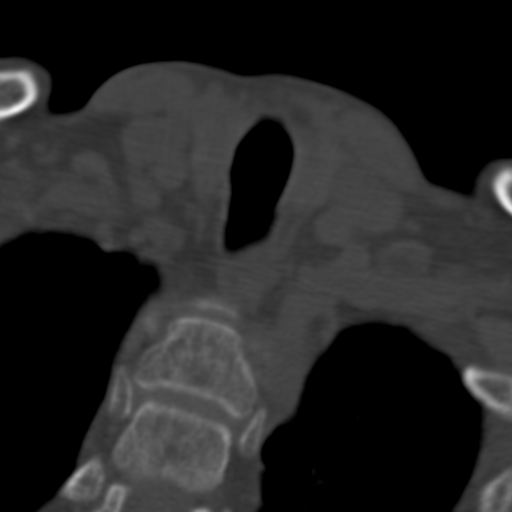
[im 27/81  bone]
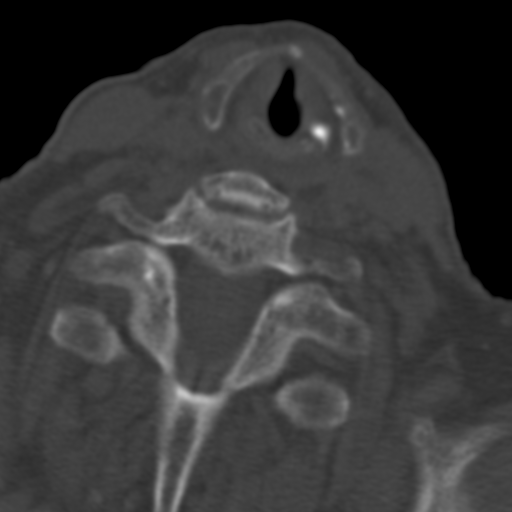
[im 41/81  bone]
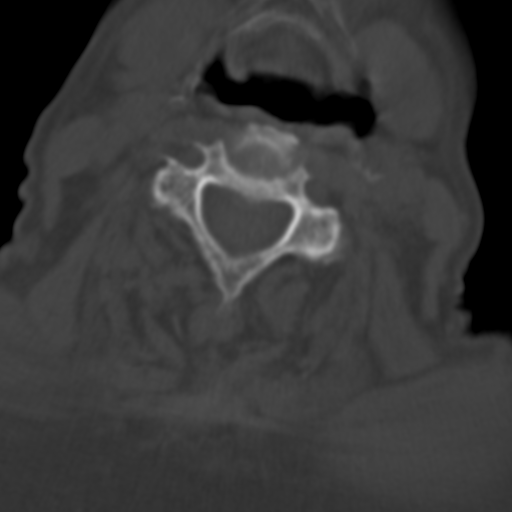
[im 54/81  bone]
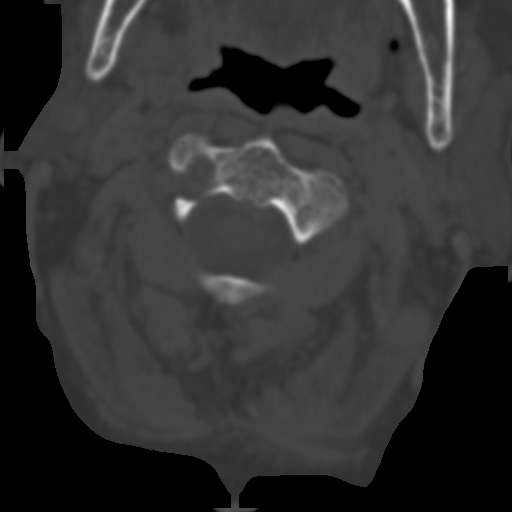
[im 67/81  soft-tissue]
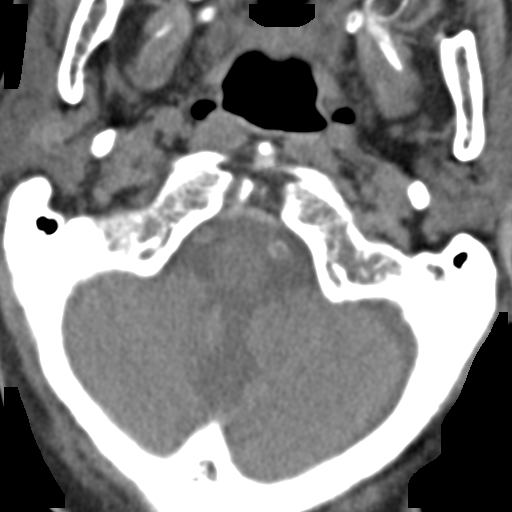
[im 67/81  bone]
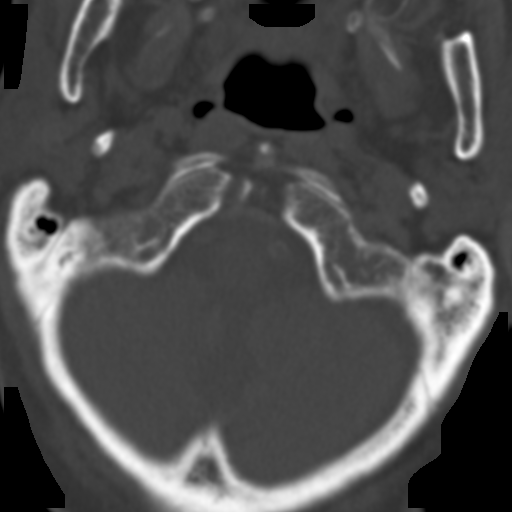

[Series 602: axial · axial · 0.31mm/px · z∈[-288,-234]mm · 3 of 83 slices shown]
[im 14/83  bone]
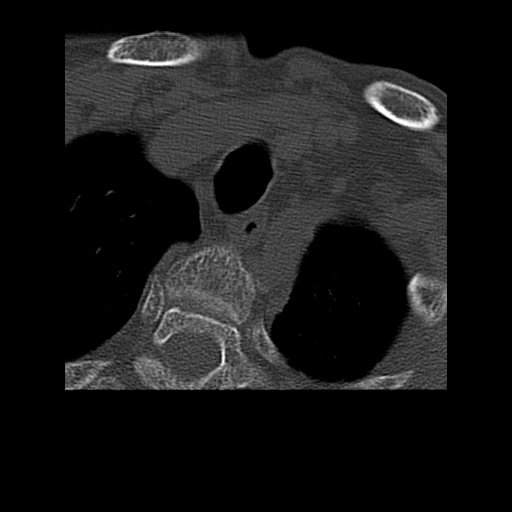
[im 28/83  bone]
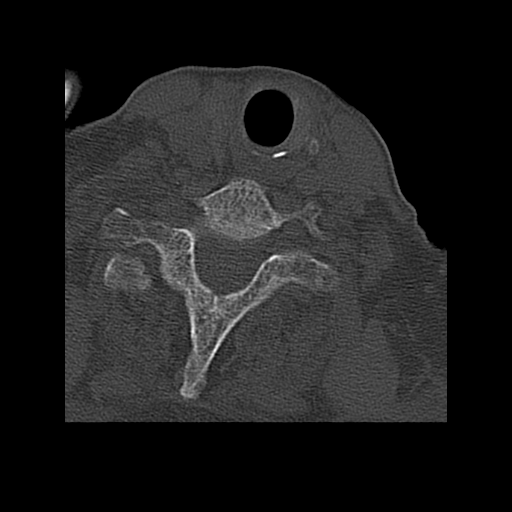
[im 42/83  bone]
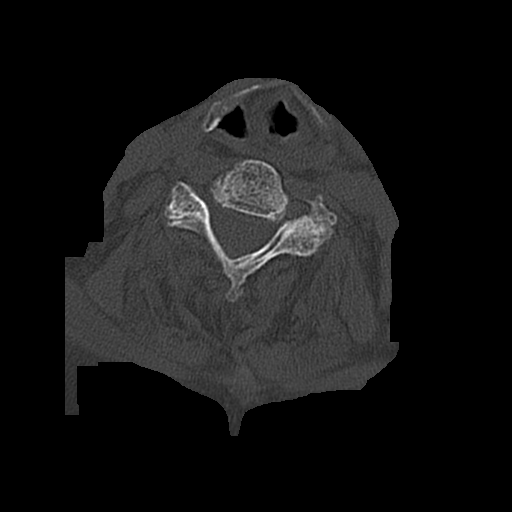

[Series 603: coronal 1 · coronal · 0.31mm/px · 3 of 46 slices shown]
[im 10/46  bone]
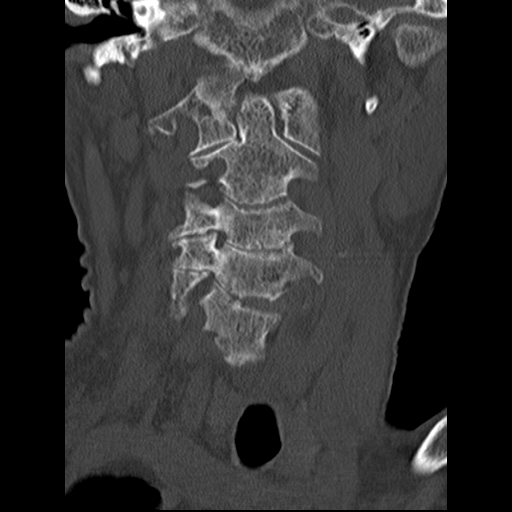
[im 19/46  bone]
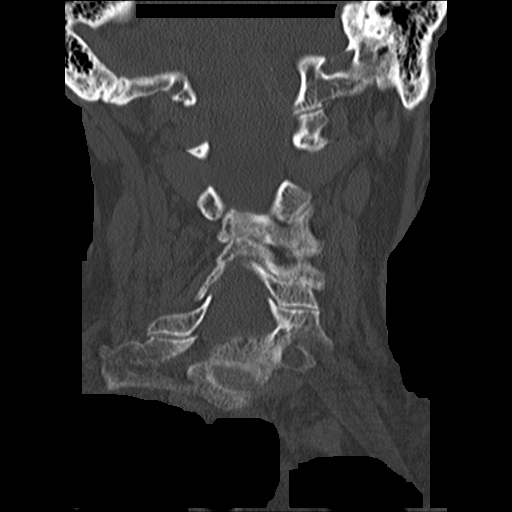
[im 28/46  bone]
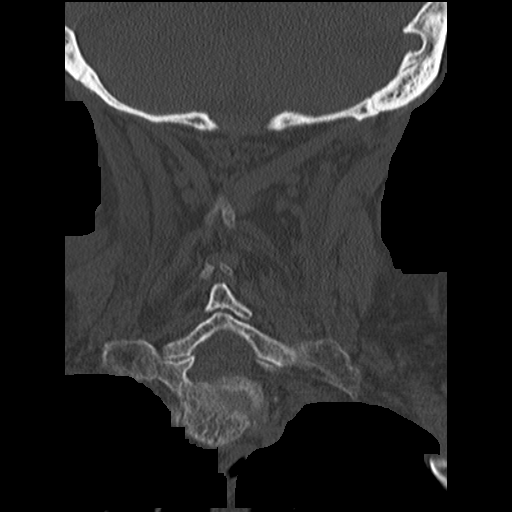

[Series 605: sagittal · sagittal · 0.31mm/px · 5 of 44 slices shown, 6 images]
[im 15/44  bone]
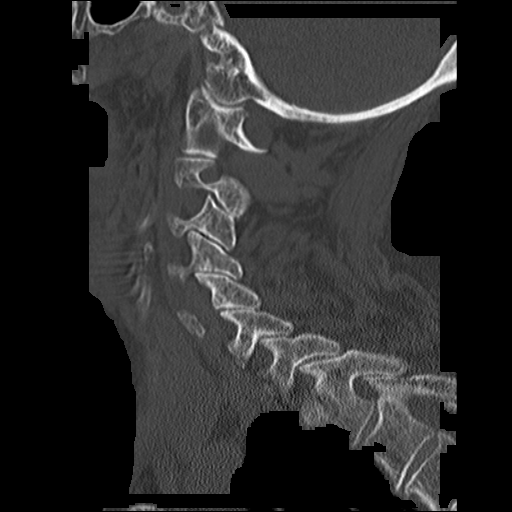
[im 18/44  bone]
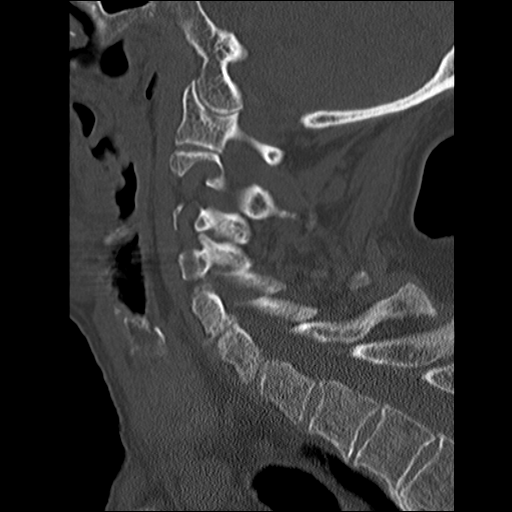
[im 22/44  soft-tissue]
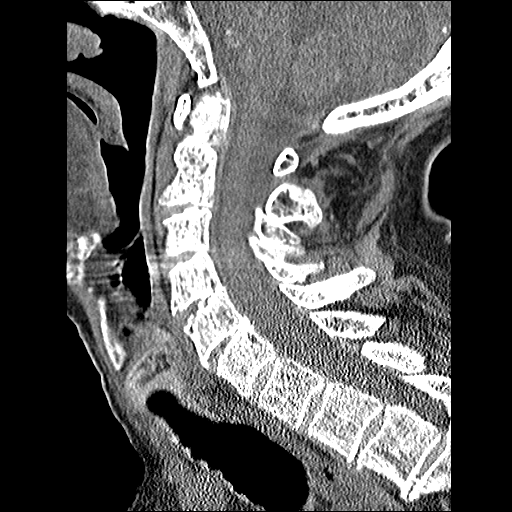
[im 22/44  bone]
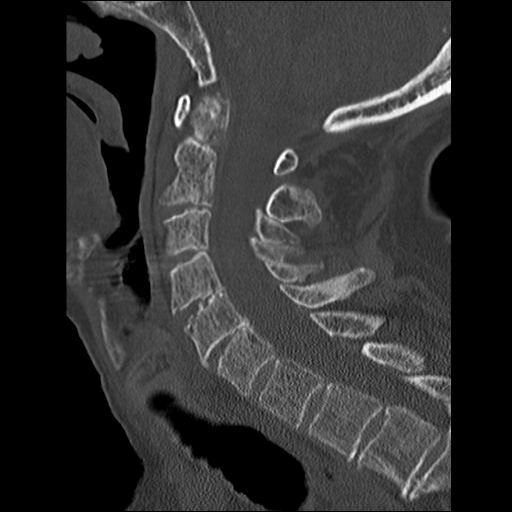
[im 26/44  bone]
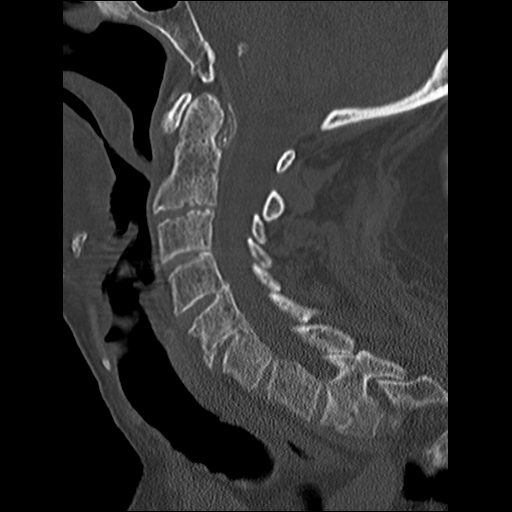
[im 29/44  bone]
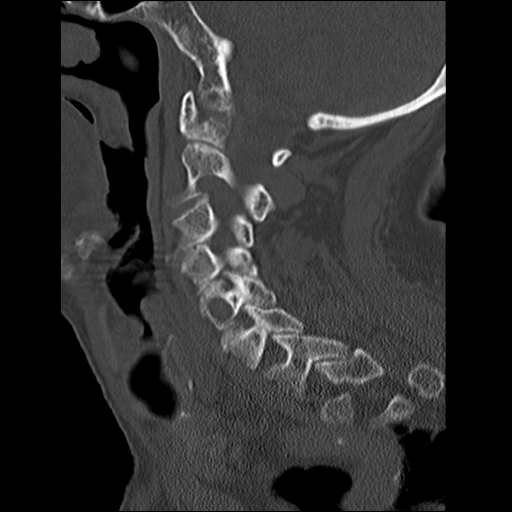

[16 of 33 positions shown; findings below may reference images not displayed]

FINDINGS: Bilateral cervical ribs are incidentally noted.
Exaggerated cervical lordosis.  Calcification of the transverse
ligament of the atlas.  Vertebral artery atherosclerosis.
Multilevel cervical spondylosis is present without fracture,
subluxation, or dislocation.  Craniocervical alignment appears
normal.  Levoconvex curvature of the cervical spine is present.
Osteopenia.
IMPRESSION: No acute cervical spine abnormality.  Exaggerated cervical lordosis
and osteopenia with mild multilevel spondylosis.

## 2010-12-07 IMAGING — CR DG SHOULDER 2+V*L*
4 series · 4 of 4 positions shown · non-contrast
Comparison: None.

CLINICAL DATA: Left hip fracture.  Left anterior shoulder pain.

LEFT SHOULDER - 2+ VIEW

[view not recorded (1 of 4)]
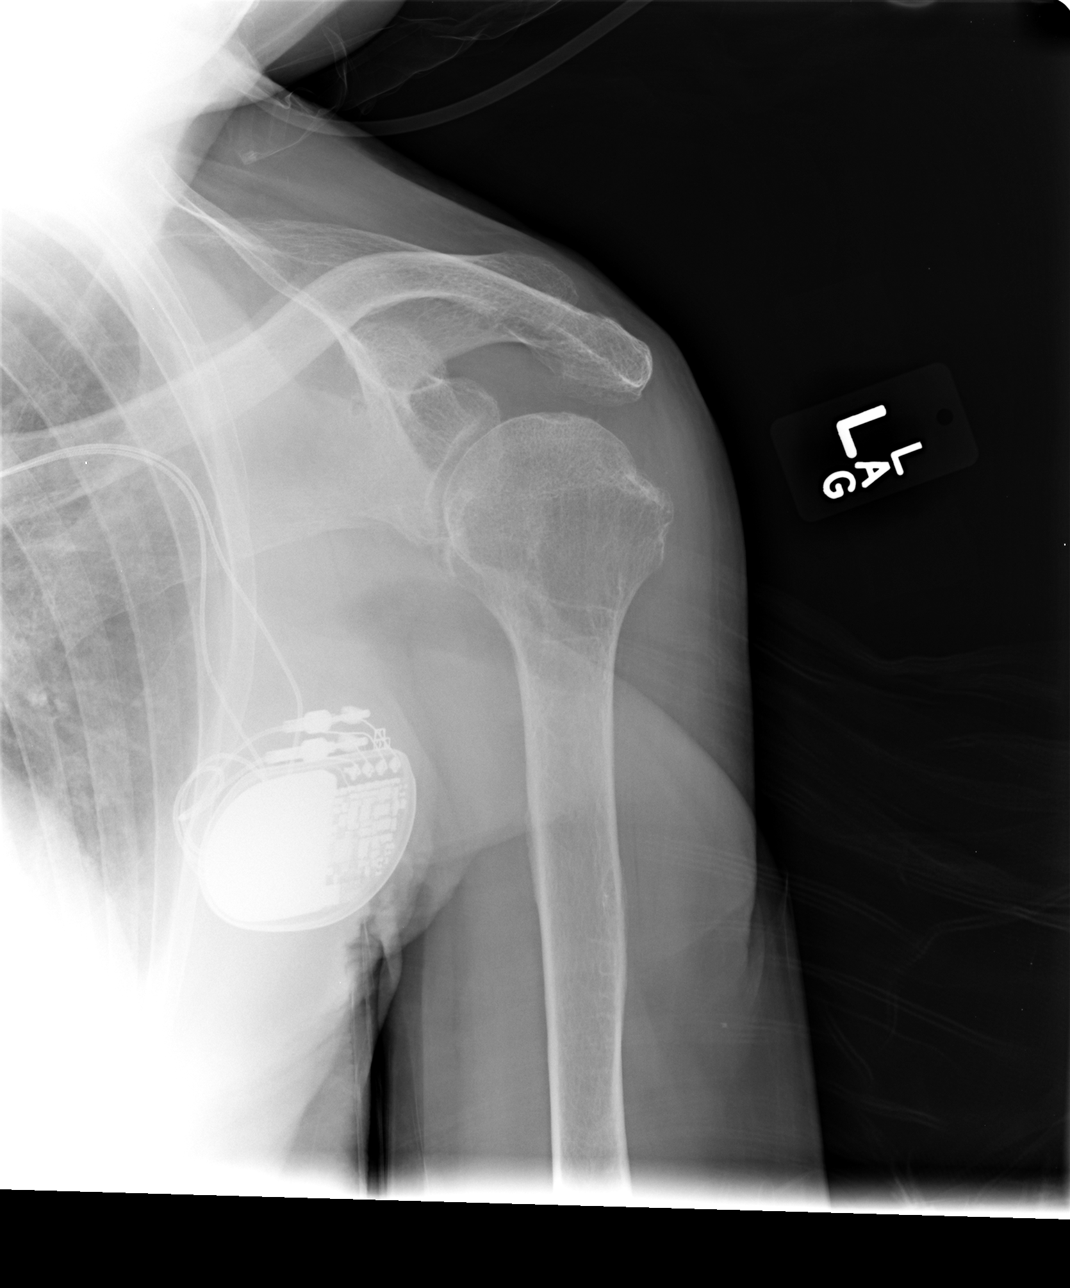

[view not recorded (2 of 4)]
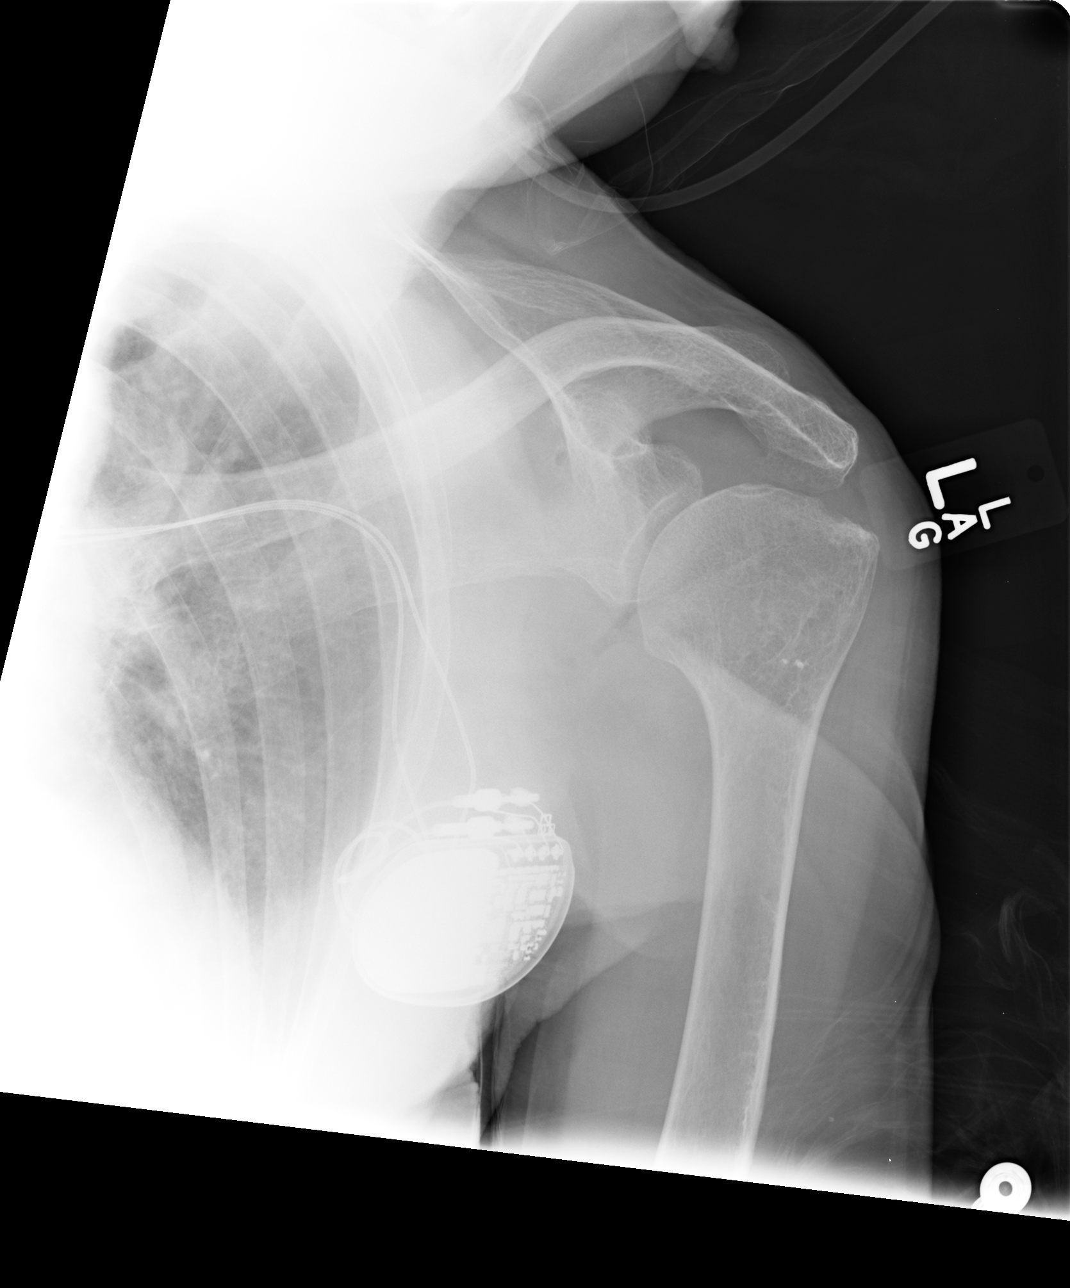

[view not recorded (3 of 4)]
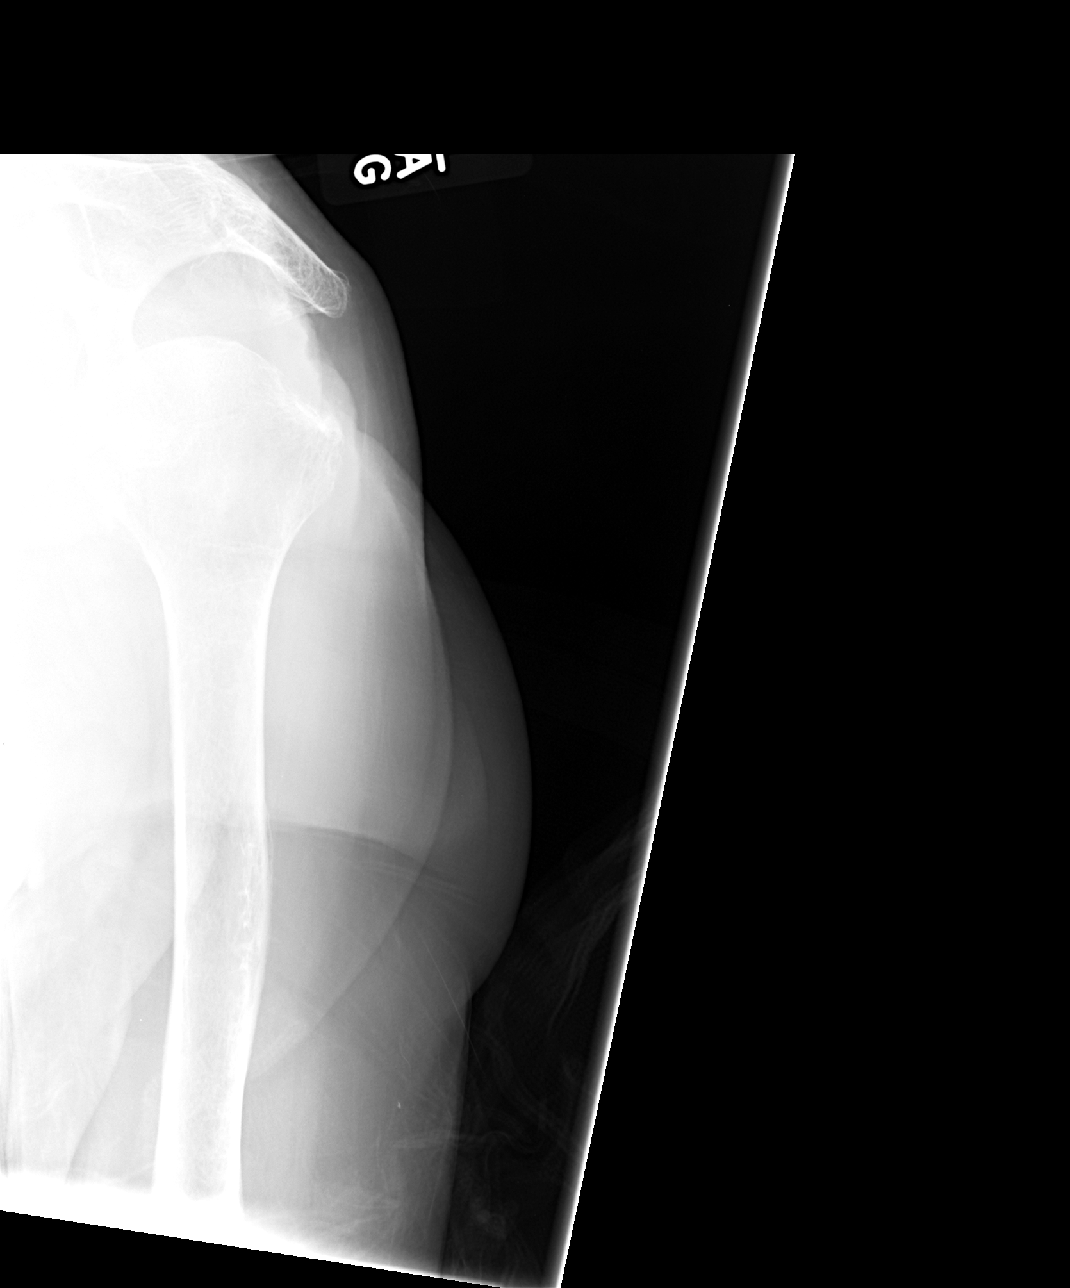

[view not recorded (4 of 4)]
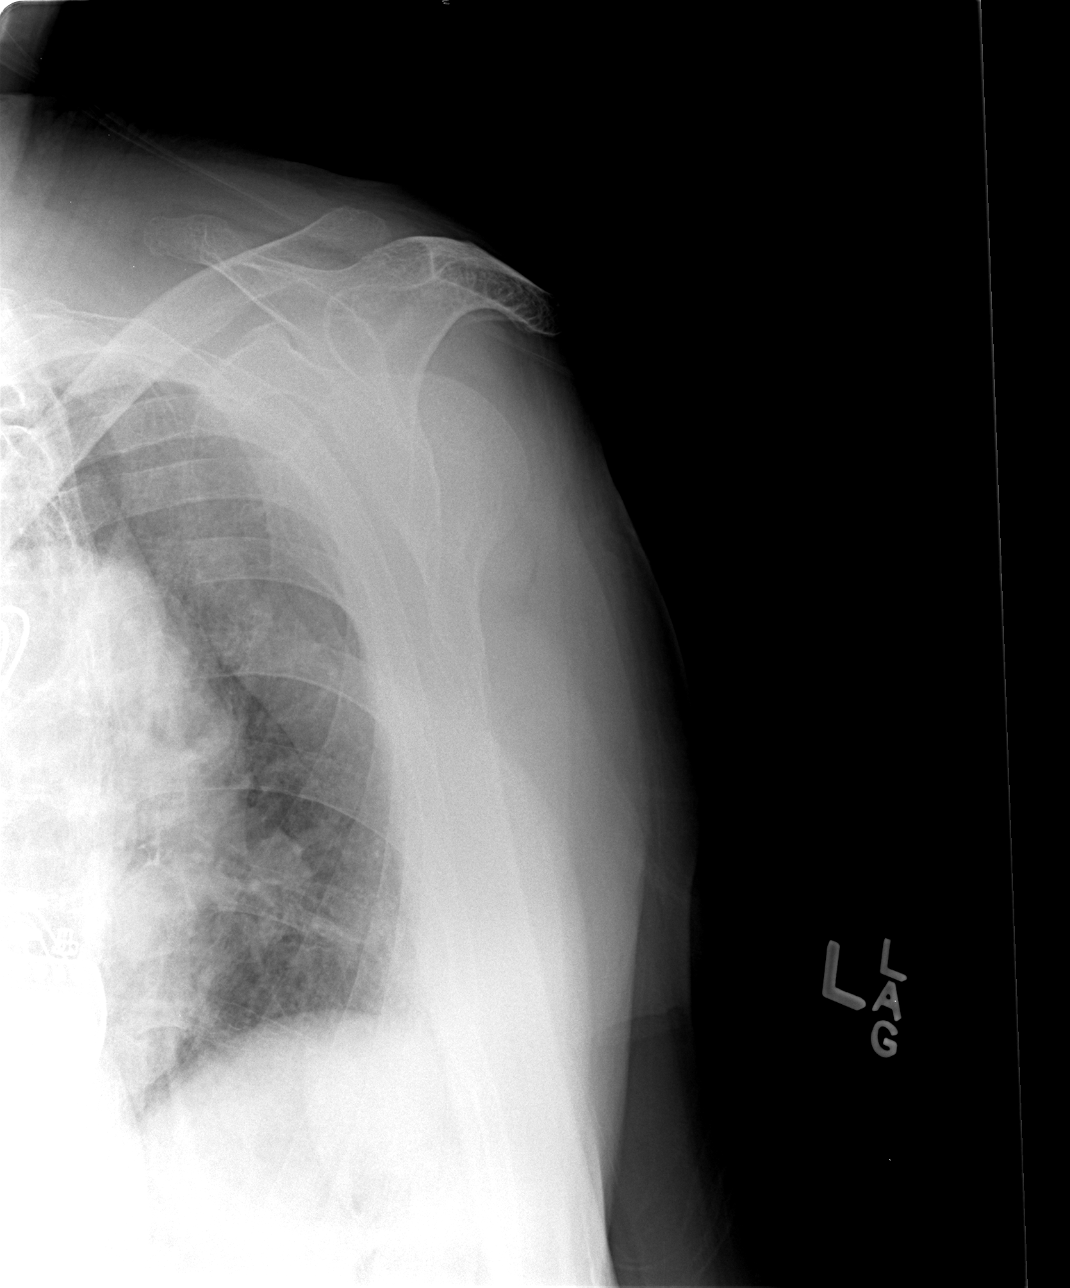

[4 of 4 positions shown; findings below may reference images not displayed]

FINDINGS: The left shoulder appears located.  There is a small
lucency in the superior articular surface which may represent old
focus of AVN or small osteochondral lesion.  The partially
visualized left subclavian pacemaker.  No fracture is identified.
Subacromial hook is present.  On the scapular Y view, there is
irregularity of the coracoid process suspicious for nondisplaced
fracture.
IMPRESSION: 1.  Cortical irregularity of the coracoid process suspicious for
fracture.  This is only seen on the scapular Y view.
2.  Probable old focus of AVN in the superior humeral head.
3.  Large subacromial hook.

## 2010-12-08 LAB — CROSSMATCH: Antibody Screen: NEGATIVE

## 2010-12-10 LAB — CROSSMATCH: ABO/RH(D): B POS

## 2010-12-11 ENCOUNTER — Other Ambulatory Visit: Payer: Self-pay | Admitting: Hematology & Oncology

## 2010-12-11 ENCOUNTER — Encounter (HOSPITAL_BASED_OUTPATIENT_CLINIC_OR_DEPARTMENT_OTHER): Payer: PRIVATE HEALTH INSURANCE | Admitting: Hematology & Oncology

## 2010-12-11 DIAGNOSIS — D649 Anemia, unspecified: Secondary | ICD-10-CM

## 2010-12-11 DIAGNOSIS — N039 Chronic nephritic syndrome with unspecified morphologic changes: Secondary | ICD-10-CM

## 2010-12-11 DIAGNOSIS — N189 Chronic kidney disease, unspecified: Secondary | ICD-10-CM

## 2010-12-11 LAB — CBC WITH DIFFERENTIAL (CANCER CENTER ONLY)
BASO#: 0 10*3/uL (ref 0.0–0.2)
EOS%: 4.1 % (ref 0.0–7.0)
Eosinophils Absolute: 0.2 10*3/uL (ref 0.0–0.5)
HCT: UNDETERMINED % (ref 34.8–46.6)
HGB: 10.9 g/dL — ABNORMAL LOW (ref 11.6–15.9)
LYMPH%: 22.8 % (ref 14.0–48.0)
MCH: UNDETERMINED pg (ref 26.0–34.0)
MCHC: UNDETERMINED g/dL (ref 32.0–36.0)
MCV: UNDETERMINED fL (ref 81–101)
MONO%: 8.8 % (ref 0.0–13.0)
NEUT#: 3.6 10*3/uL (ref 1.5–6.5)
NEUT%: 64.1 % (ref 39.6–80.0)
RBC: UNDETERMINED 10*6/uL (ref 3.70–5.32)

## 2010-12-13 LAB — CROSSMATCH

## 2010-12-15 LAB — CROSSMATCH: Antibody Screen: NEGATIVE

## 2010-12-17 LAB — CROSSMATCH: ABO/RH(D): B POS

## 2010-12-22 LAB — URINALYSIS, ROUTINE W REFLEX MICROSCOPIC
Glucose, UA: NEGATIVE mg/dL
Ketones, ur: NEGATIVE mg/dL
Protein, ur: NEGATIVE mg/dL
pH: 5 (ref 5.0–8.0)

## 2010-12-22 LAB — DIFFERENTIAL
Band Neutrophils: 0 % (ref 0–10)
Basophils Absolute: 0 10*3/uL (ref 0.0–0.1)
Basophils Relative: 0 % (ref 0–1)
Blasts: 0 %
Lymphocytes Relative: 13 % (ref 12–46)
Lymphocytes Relative: 14 % (ref 12–46)
Lymphs Abs: 0.8 10*3/uL (ref 0.7–4.0)
Lymphs Abs: 1.7 10*3/uL (ref 0.7–4.0)
Monocytes Absolute: 0.5 10*3/uL (ref 0.1–1.0)
Monocytes Absolute: 0.9 10*3/uL (ref 0.1–1.0)
Monocytes Relative: 7 % (ref 3–12)
Monocytes Relative: 8 % (ref 3–12)
Neutro Abs: 9.7 10*3/uL — ABNORMAL HIGH (ref 1.7–7.7)

## 2010-12-22 LAB — COMPREHENSIVE METABOLIC PANEL
ALT: 51 U/L — ABNORMAL HIGH (ref 0–35)
ALT: 63 U/L — ABNORMAL HIGH (ref 0–35)
AST: 72 U/L — ABNORMAL HIGH (ref 0–37)
AST: 92 U/L — ABNORMAL HIGH (ref 0–37)
Albumin: 3 g/dL — ABNORMAL LOW (ref 3.5–5.2)
Albumin: 3.4 g/dL — ABNORMAL LOW (ref 3.5–5.2)
Alkaline Phosphatase: 114 U/L (ref 39–117)
BUN: 12 mg/dL (ref 6–23)
CO2: 29 mEq/L (ref 19–32)
Calcium: 8.4 mg/dL (ref 8.4–10.5)
Calcium: 9 mg/dL (ref 8.4–10.5)
Chloride: 100 mEq/L (ref 96–112)
Creatinine, Ser: 0.82 mg/dL (ref 0.4–1.2)
GFR calc Af Amer: 53 mL/min — ABNORMAL LOW (ref >60)
GFR calc Af Amer: 60 mL/min — ABNORMAL LOW (ref >60)
GFR calc Af Amer: >60 mL/min (ref >60)
Glucose, Bld: 137 mg/dL — ABNORMAL HIGH (ref 70–99)
Potassium: 4.4 mEq/L (ref 3.5–5.1)
Sodium: 136 mEq/L (ref 135–145)
Sodium: 140 mEq/L (ref 135–145)
Sodium: 141 mEq/L (ref 135–145)
Total Protein: 5.7 g/dL — ABNORMAL LOW (ref 6.0–8.3)
Total Protein: 5.8 g/dL — ABNORMAL LOW (ref 6.0–8.3)
Total Protein: 6.1 g/dL (ref 6.0–8.3)

## 2010-12-22 LAB — POCT I-STAT, CHEM 8
BUN: 49 mg/dL — ABNORMAL HIGH (ref 6–23)
Calcium, Ion: 1.2 mmol/L (ref 1.12–1.32)
Chloride: 100 mEq/L (ref 96–112)
Chloride: 114 mEq/L — ABNORMAL HIGH (ref 96–112)
Creatinine, Ser: 0.9 mg/dL (ref 0.4–1.2)
Creatinine, Ser: 1.4 mg/dL — ABNORMAL HIGH (ref 0.4–1.2)
Creatinine, Ser: 1.4 mg/dL — ABNORMAL HIGH (ref 0.4–1.2)
Glucose, Bld: 134 mg/dL — ABNORMAL HIGH (ref 70–99)
Glucose, Bld: 99 mg/dL (ref 70–99)
Hemoglobin: 9.2 g/dL — ABNORMAL LOW (ref 12.0–15.0)
Potassium: 3.5 mEq/L (ref 3.5–5.1)
Potassium: 7.2 mEq/L (ref 3.5–5.1)
Sodium: 139 mEq/L (ref 135–145)
TCO2: 24 mmol/L (ref 0–100)

## 2010-12-22 LAB — URINE MICROSCOPIC-ADD ON

## 2010-12-22 LAB — CBC
Hemoglobin: 11.8 g/dL — ABNORMAL LOW (ref 12.0–15.0)
MCHC: 35 g/dL (ref 30.0–36.0)
RBC: 3.71 MIL/uL — ABNORMAL LOW (ref 3.87–5.11)
WBC: 12.4 10*3/uL — ABNORMAL HIGH (ref 4.0–10.5)

## 2010-12-22 LAB — TYPE AND SCREEN
Antibody Screen: NEGATIVE
Antibody Screen: NEGATIVE

## 2010-12-22 LAB — BASIC METABOLIC PANEL
CO2: 25 mEq/L (ref 19–32)
Calcium: 8.3 mg/dL — ABNORMAL LOW (ref 8.4–10.5)
Creatinine, Ser: 1.15 mg/dL (ref 0.4–1.2)
GFR calc Af Amer: 54 mL/min — ABNORMAL LOW (ref >60)
Glucose, Bld: 86 mg/dL (ref 70–99)

## 2010-12-22 LAB — POCT CARDIAC MARKERS
CKMB, poc: 3.3 ng/mL (ref 1.0–8.0)
Myoglobin, poc: 193 ng/mL (ref 12–200)
Myoglobin, poc: 81.4 ng/mL (ref 12–200)
Troponin i, poc: <0.05 ng/mL (ref 0.00–0.09)

## 2010-12-22 LAB — URINE CULTURE: Colony Count: NO GROWTH

## 2010-12-22 LAB — RETICULOCYTES

## 2010-12-22 LAB — CARDIAC PANEL(CRET KIN+CKTOT+MB+TROPI)
CK, MB: 1 ng/mL (ref 0.3–4.0)
Relative Index: INVALID (ref 0.0–2.5)
Total CK: 23 U/L (ref 7–177)
Total CK: 31 U/L (ref 7–177)

## 2010-12-22 LAB — LIPASE, BLOOD: Lipase: 30 U/L (ref 11–59)

## 2010-12-22 LAB — CK TOTAL AND CKMB (NOT AT ARMC)
CK, MB: 1.1 ng/mL (ref 0.3–4.0)
Relative Index: INVALID (ref 0.0–2.5)
Total CK: 18 U/L (ref 7–177)

## 2010-12-22 LAB — IRON AND TIBC

## 2010-12-22 LAB — FOLATE

## 2010-12-22 LAB — FERRITIN

## 2011-01-04 ENCOUNTER — Other Ambulatory Visit: Payer: Self-pay | Admitting: Hematology & Oncology

## 2011-01-04 ENCOUNTER — Encounter (HOSPITAL_BASED_OUTPATIENT_CLINIC_OR_DEPARTMENT_OTHER): Payer: PRIVATE HEALTH INSURANCE | Admitting: Hematology & Oncology

## 2011-01-04 DIAGNOSIS — D631 Anemia in chronic kidney disease: Secondary | ICD-10-CM

## 2011-01-04 DIAGNOSIS — N189 Chronic kidney disease, unspecified: Secondary | ICD-10-CM

## 2011-01-04 LAB — CBC WITH DIFFERENTIAL (CANCER CENTER ONLY)
BASO%: 0.4 % (ref 0.0–2.0)
HCT: UNDETERMINED % (ref 34.8–46.6)
LYMPH%: 22.2 % (ref 14.0–48.0)
MCH: UNDETERMINED pg (ref 26.0–34.0)
MCV: UNDETERMINED fL (ref 81–101)
MONO%: 10.2 % (ref 0.0–13.0)
NEUT%: 60.3 % (ref 39.6–80.0)
Platelets: 181 10*3/uL (ref 145–400)
RDW: UNDETERMINED % (ref 11.1–15.7)

## 2011-01-19 NOTE — H&P (Signed)
[name], [name] NO.:  [id number]   MEDICAL RECORD NO.:  [id number]          PATIENT TYPE:  INP   LOCATION:  6743                         FACILITY:  [facility]   PHYSICIAN:  Michiel Cowboy, MDDATE OF BIRTH:  10/21/1914   DATE OF ADMISSION:  10/24/2008  DATE OF DISCHARGE:                              HISTORY & PHYSICAL   PRIMARY CARE PHYSICIAN:  Thora Lance, M.D.   CHIEF COMPLAINT:  Chest pain, nausea, and diarrhea.   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  The patient this morning woke up with nausea and  diarrhea.  Per her family, the rest of her family have gone through it  as well and she has more than one person in the house with GI symptoms  including mostly diarrhea and nausea and vomiting.  The patient was  complaining of minimal abdominal pain throughout the day and had very  poor p.o. intake.  About 2 p.m. she was sitting very quietly which was  noted by her granddaughter and when asked what is going on she states  she had chest pain and some shortness of breath, at which point 911 was  called.  She was brought in to the emergency department.  She is  currently chest pain free.  She described the chest pain as somebody  jumped on her chest.  It made her left arm feel a little cold and she  felt a little short of breath during this as well.  Otherwise she denies  any diaphoresis, no presyncope.  She was having nausea and diarrhea  earlier today but diarrhea was x1 episode.  She had poor appetite the  whole day.   REVIEW OF SYSTEMS:  Otherwise review of systems unremarkable.  The  patient is hard of hearing and has a hard time answering questions.   PAST MEDICAL HISTORY:  1. Significant for pacemaker for bradycardia.  2. History of questionable MI.   EKG per records but no known coronary      artery disease.  The husband confirmed.  3. History of hypertension.  4. B12 deficiency.   SOCIAL HISTORY:  The patient lives with her daughter and granddaughter.  Does not smoke or use drugs.   FAMILY HISTORY:  Noncontributory.   ALLERGIES:  No known drug allergies.   MEDICATIONS:  1. Aspirin 81 mg p.o. daily.  2. Folic acid.  3. B12 shots.  4. Hydrochlorothiazide 25 mg daily.  5. Timolol 0.5% drops to both eyes at bedtime.  6. The patient also on occasion takes Propoxyphene for pain.   PHYSICAL EXAMINATION:  VITAL SIGNS:  Temperature 97.5, blood pressure  116/67, now down to 106/49, pulse 73, respirations 14, saturating 99% on  room air.  GENERAL:  The patient appears to be currently comfortable in no acute  distress.  HEENT:  Head nontraumatic.  Somewhat dryish mucous membranes.  LUNGS:  Clear to auscultation  bilaterally.  HEART:  Regular rate and rhythm.  No murmurs could be appreciated.  ABDOMEN:  Soft, nontender, nondistended.  EXTREMITIES:  Lower extremities without clubbing.  There is mild  cyanosis which per family she had before and had a test which was  negative.  No edema noted.  NEUROLOGIC:  The patient appears to be intact.   LABORATORY DATA:  White blood cell count 12.4, hemoglobin 8.8.  Sodium  143, potassium 4, creatinine 1.4.  LFTs are within normal limits.  Lipase 21.  BNP 116.  Cardiac enzymes I-Stat negative.   Chest x-ray showed low lung volumes, though stable cardiomegaly and EKG  is paced.   ASSESSMENT/PLAN:  This is a 77 year old female with chest pain which is  somewhat atypical after an episode of diarrhea and vomiting.  1. Chest pain.  Given advanced age and hypertension, will admit and      cycle cardiac enzymes.  Check fasting lipid panel, hemoglobin A1c.      Check TSH.  For now will continue aspirin.  2. Diarrhea.  This seems to be infectious etiology as the rest of the      patient's family have  gone through that.  Will send for culture.      Will check C. difficile although less likely, and will check white      blood cell count in the stool.  For now will monitor.  3. Dehydration.  Will give IV fluids and monitor carefully.  Check      orthostatics.  4. Anemia.  Will type and screen.  If progressive may need to      transfuse.  If the patient has questionable coronary artery disease      will obtain Hemoccult stools.  5. Slightly elevated potassium and BUN and creatinine.  Seem to be      fluctuating per labs.  Will repeat lab.  Instead of I-Stat will      obtain official labs and follow.  If truly elevated will need a      renal ultrasound.  The patient may be dehydrated.  6. History of hypertension, currently on the low side.  Will hold      hydrochlorothiazide.  Give IV fluids.  7. Prophylaxis.  Protonix plus SCDs until Hemoccult stools are      negative.   CODE STATUS:  Unclear.  The family is discussing it currently.  For now  will make full code until a decision has been made.  Dr.  Kirby Funk  will assume care in the morning.      Michiel Cowboy, MD  Electronically Signed     AVD/MEDQ  D:  10/24/2008  T:  10/24/2008  Job:  [id number]   cc:   Thora Lance, M.D.

## 2011-01-19 NOTE — Discharge Summary (Signed)
NAMEJASHAWNA, [name] NO.:  [id number]   MEDICAL RECORD NO.:  [id number]           PATIENT TYPE:   LOCATION:                                 FACILITY:   PHYSICIAN:  Thora Lance, M.D.  DATE OF BIRTH:  1915-01-22   DATE OF ADMISSION:  DATE OF DISCHARGE:                               DISCHARGE SUMMARY   REASON FOR ADMISSION:  [name] is a 77 year old black female who  presented with chest pain.  The patient presented with chest pain as  well as nausea and diarrhea.  She felt like she had some shortness of  breath, 911 was called.   SIGNIFICANT FINDINGS:  VITAL SIGNS:  Temperature 97.5, blood pressure  116/67, pulse 73, and respirations 14.  LUNGS:  Clear.  HEART:  Regular rate and rhythm without murmur, gallop, or rub.  ABDOMEN:  Soft, nontender, nondistended.  EXTREMITIES:  No edema.   LABORATORY FINDINGS:  WBC 12.4, hemoglobin 8.8, potassium 4.0, sodium  143, creatinine 1.4, lipase 21, and BNP 116.   HOSPITAL COURSE:  The patient was admitted with chest pain.  She had  serial cardiac enzymes, which were negative.  Her pain was very  atypical.  On the second hospital day, she had no complaints.  She did  have some mild gastroenteritis and she had IV fluids and supportive  care.  Her family have requested that she to be discharged.  We felt  that her pain was atypical and likely noncardiac, and she was discharged  home in good condition.   DISCHARGE DIAGNOSES:  1. Chest pain.  2. Gastroenteritis.  3. Pacemaker.  4. Hypertension.  5. Cold agglutinin disease.   PROCEDURES:  None.   DISCHARGE MEDICATIONS:  1. Aspirin 81 mg.  2. Folic acid.  3. B12 shots.  4. Hydrochlorothiazide 25 mg a day.  5. Timolol 0.5% drops both eyes at bedtime.   DISPOSITION:  Discharged home.   FOLLOWUP:  As needed with Dr. Valentina Lucks.           ______________________________  Thora Lance, M.D.     JJG/MEDQ  D:  12/08/2008  T:  12/09/2008  Job:  [id number]

## 2011-01-22 NOTE — Consult Note (Signed)
[place]  Patient:    [name]                     MRN: [id number] Proc. Date: 08/12/99 Adm. Date:  [id number] Attending:  Lillia Mountain CC:         Thora Lance, M.D.                          Consultation Report  CONCLUSIONS: 1. Transient dizziness presumed secondary to atrioventricular block, Mobitz 2. 2. 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 3. Possible prior ______  myocardial infarction based on chronic EKG    abnormalities. 4. History of left hip degenerative joint disease. 5. Hypertension. 6. Status post cholecystectomy.  PLAN: 1. Admit to telemetry. 2. Observe for an excessive protracted bradycardia. If it develops, we will need    temporary transvenous pacemaker. Otherwise, we will cover the patient with    transcutaneous pacing only if patient is symptomatic with excessive bradycardia. 3. Permanent DVD pacemaker to be placed on August 13, 1999 by Francisca December,    M.D. 4. Continue medications as previously taken. 5. TSH to rule out hypothyroidism.  HISTORY OF PRESENT ILLNESS:  The patient is 30 and gives a two-week history of intermittent dizziness. She had a 15-minute episode of dizziness on the day of admission. She came to Dr. Kandyce Rud office, and EKG demonstrated bradycardia in  the 50s with intermittent second degree AV block. She is admitted to the hospital for management of this problem.  CURRENT MEDICATIONS: 1. Lotensin HCT 20/25 mg one a day. 2. Aspirin 81 mg per day. 3. Advil one a day.  ALLERGIES:  None.  PHYSICAL EXAMINATION:  GENERAL:  The patient is in no distress.  VITAL SIGNS:  Her blood pressure is 140/72, the heart rate is between 50 and 60, she is afebrile, O2 saturation on room air is 95.  NECK:  The neck veins are not significantly distended. No carotid bruits  are heard.  LUNGS:  Clear to auscultation and percussion.  CARDIAC:  Reveals 1/6 systolic murmur. No diastolic murmurs are heard.  ABDOMEN:  Soft. Liver and spleen are not palpable. Bowel sounds are normal.  EXTREMITIES:  Reveal no edema.  LABORATORY DATA:  EKG on August 12, 1999, revealed right bundle branch block, eft anterior hemiblock, ______ Q waves in V1 and V2. Intermittent secondary AV block. Mobitz 1 versus Mobitz 2.  Chest x-ray pending.  Potassium is 3.8. Other labs are pending. DD:  08/12/99 TD:  08/12/99 Job: [id number]

## 2011-01-22 NOTE — Op Note (Signed)
NAMENATTALIE, SANTIESTEBAN NO.:  [id number]   MEDICAL RECORD NO.:  [id number]          PATIENT TYPE:  AMB   LOCATION:  CATH                         FACILITY:  [facility]   PHYSICIAN:  Francisca December, M.D.  DATE OF BIRTH:  Sep 10, 1914   DATE OF PROCEDURE:  06/25/2005  DATE OF DISCHARGE:                                 OPERATIVE REPORT   PROCEDURE PERFORMED:  1.  Insert temporary transvenous pacemaker under fluoroscopy.  2.  Removal of old pacing generator.  3.  Insert new dual-chamber pacemaker generator was lead testing.   SURGEON:  Francisca December, M.D.   INDICATION:  [name] is an 77 year old woman who has now reached end  of life on her previously placed Guidant pacemaker insert in 1999.  It has  reverted the backup VVI pacing at 50 beats a minute.  It will not respond to  interrogator commands.  She is brought now to the catheterization laboratory  for a battery replacement.  It is uncertain whether she has underlying  rhythm.   PROCEDURAL NOTE:  The patient was brought to the cardiac catheterization  laboratory in the fasting state.  The right groin and left prepectoral  region were prepped and draped in the usual sterile fashion.  Local  anesthesia was obtained with the infiltration of 1% lidocaine in the right  groin.  A 6-French catheter sheath was inserted percutaneously into the  right femoral artery utilizing an anterior approach over a guiding J-wire.  a balloon flow-directed pacing wire was then advanced under fluoroscopy to  the right ventricular apex.  There, adequate temporary pacing parameters  were obtained and the device was placed at 5-mA output, rate of 45, full  demand.  After changing gown and gloves, attention was directed to the left  prepectoral region.  There, anesthesia was obtained with the infiltration of  1% lidocaine with epinephrine throughout.  A 6- to 7-cm incision was then  made over the old pacing generator and this was  carried down by sharp  dissection to the pacemaker capsule.  The capsule was incised and the  pacemaker was delivered without difficulty.  The leads were detached from  the pacing generator and the patient promptly reverted to pacing at 45 beats  per minute via the temporary pacer.  We did turn the temporary pacer down to  30 and the patient did have an occasional escape beat.   The leads were detached from the old pacing generator and each was tested  for adequate pacing parameters.  This is reported below.  The wound was then  copiously irrigated using 1% kanamycin solution.  The leads were attached to  the new pacing generator, carefully identifying each by its serial number  and placing each into the appropriate receptacle.  Each lead was tightened  into place and tested for security.  The pacing generator was then placed  back into the previous pocket.  The pocket was then closed using 2-0 Vicryl  in a running fashion for the subcutaneous layer.  The skin was approximated  using 4-0 Vicryl in a running subcuticular fashion.  Steri-Strips and a  sterile dressing were applied and the patient was transported to the  recovery area in stable condition in an A-sense/V-pace mode.   EQUIPMENT DATA:  The new pacing generator is a Medtronic Kappa model number  [id number], serial number [id number].   PACING DATA:  The atrial lead detected a 2.7-mV P wave.  The pacing  threshold was 0.6 volts at 0.5-millisecond pulse width.  The impedance was  726 ohms, resulting in a currented capture threshold of 1.8 mA.  The  ventricular lead detected an 8.2 mV R wave.  The pacing threshold was 0.4  volts at 0.5-millisecond pulse width.  The impedance was 376 ohms, resulting  in a currented capture threshold of 1.4 mA.   At the completion of the procedure, the temporary pacing wire was  discontinued as well as the right femoral vein sheath.  Hemostasis was  achieved by direct pressure and the patient was  transported as mentioned  above.      Francisca December, M.D.  Electronically Signed     JHE/MEDQ  D:  06/25/2005  T:  06/25/2005  Job:  [id number]

## 2011-02-15 ENCOUNTER — Other Ambulatory Visit: Payer: Self-pay | Admitting: Hematology & Oncology

## 2011-02-15 ENCOUNTER — Encounter (HOSPITAL_BASED_OUTPATIENT_CLINIC_OR_DEPARTMENT_OTHER): Payer: PRIVATE HEALTH INSURANCE | Admitting: Hematology & Oncology

## 2011-02-15 DIAGNOSIS — D631 Anemia in chronic kidney disease: Secondary | ICD-10-CM

## 2011-02-15 DIAGNOSIS — N189 Chronic kidney disease, unspecified: Secondary | ICD-10-CM

## 2011-02-15 DIAGNOSIS — D649 Anemia, unspecified: Secondary | ICD-10-CM

## 2011-02-15 LAB — CBC WITH DIFFERENTIAL (CANCER CENTER ONLY)
BASO%: 0.3 % (ref 0.0–2.0)
EOS%: 3.5 % (ref 0.0–7.0)
LYMPH#: 1.8 10*3/uL (ref 0.9–3.3)
LYMPH%: 27.4 % (ref 14.0–48.0)
MCHC: UNDETERMINED g/dL (ref 32.0–36.0)
MCV: UNDETERMINED fL (ref 81–101)
MONO#: 0.7 10*3/uL (ref 0.1–0.9)
NEUT%: 58.5 % (ref 39.6–80.0)
Platelets: 216 10*3/uL (ref 145–400)
RDW: UNDETERMINED % (ref 11.1–15.7)
WBC: 6.5 10*3/uL (ref 3.9–10.0)

## 2011-02-15 LAB — CHCC SATELLITE - SMEAR

## 2011-03-18 ENCOUNTER — Encounter (HOSPITAL_BASED_OUTPATIENT_CLINIC_OR_DEPARTMENT_OTHER): Payer: PRIVATE HEALTH INSURANCE | Admitting: Hematology & Oncology

## 2011-03-18 DIAGNOSIS — N039 Chronic nephritic syndrome with unspecified morphologic changes: Secondary | ICD-10-CM

## 2011-03-18 DIAGNOSIS — N189 Chronic kidney disease, unspecified: Secondary | ICD-10-CM

## 2011-04-30 ENCOUNTER — Encounter (HOSPITAL_BASED_OUTPATIENT_CLINIC_OR_DEPARTMENT_OTHER): Payer: PRIVATE HEALTH INSURANCE | Admitting: Hematology & Oncology

## 2011-04-30 DIAGNOSIS — N189 Chronic kidney disease, unspecified: Secondary | ICD-10-CM

## 2011-04-30 DIAGNOSIS — D631 Anemia in chronic kidney disease: Secondary | ICD-10-CM

## 2011-06-02 LAB — CROSSMATCH: Antibody Screen: NEGATIVE

## 2011-06-15 ENCOUNTER — Other Ambulatory Visit: Payer: Self-pay | Admitting: Hematology & Oncology

## 2011-06-15 ENCOUNTER — Encounter (HOSPITAL_BASED_OUTPATIENT_CLINIC_OR_DEPARTMENT_OTHER): Payer: PRIVATE HEALTH INSURANCE | Admitting: Hematology & Oncology

## 2011-06-15 DIAGNOSIS — N189 Chronic kidney disease, unspecified: Secondary | ICD-10-CM

## 2011-06-15 DIAGNOSIS — N289 Disorder of kidney and ureter, unspecified: Secondary | ICD-10-CM

## 2011-06-15 DIAGNOSIS — D638 Anemia in other chronic diseases classified elsewhere: Secondary | ICD-10-CM

## 2011-06-15 DIAGNOSIS — D631 Anemia in chronic kidney disease: Secondary | ICD-10-CM

## 2011-06-15 DIAGNOSIS — D591 Autoimmune hemolytic anemia, unspecified: Secondary | ICD-10-CM

## 2011-06-15 LAB — CROSSMATCH: Antibody Screen: NEGATIVE

## 2011-06-15 LAB — CBC WITH DIFFERENTIAL (CANCER CENTER ONLY)
BASO#: 0 10*3/uL (ref 0.0–0.2)
Eosinophils Absolute: 0.4 10*3/uL (ref 0.0–0.5)
HCT: UNDETERMINED % (ref 34.8–46.6)
HGB: 9.7 g/dL — ABNORMAL LOW (ref 11.6–15.9)
LYMPH#: 1.4 10*3/uL (ref 0.9–3.3)
MCH: UNDETERMINED pg (ref 26.0–34.0)
MCHC: UNDETERMINED g/dL (ref 32.0–36.0)
MONO%: 9.4 % (ref 0.0–13.0)
NEUT#: 4.2 10*3/uL (ref 1.5–6.5)
NEUT%: 63.7 % (ref 39.6–80.0)
RBC: UNDETERMINED 10*6/uL (ref 3.70–5.32)

## 2011-07-09 ENCOUNTER — Other Ambulatory Visit: Payer: Self-pay | Admitting: Hematology & Oncology

## 2011-07-09 ENCOUNTER — Encounter (HOSPITAL_BASED_OUTPATIENT_CLINIC_OR_DEPARTMENT_OTHER): Payer: PRIVATE HEALTH INSURANCE | Admitting: Hematology & Oncology

## 2011-07-09 LAB — CBC WITH DIFFERENTIAL (CANCER CENTER ONLY)
BASO#: 0 10*3/uL (ref 0.0–0.2)
Eosinophils Absolute: 0.3 10*3/uL (ref 0.0–0.5)
HCT: UNDETERMINED % (ref 34.8–46.6)
HGB: 9.9 g/dL — ABNORMAL LOW (ref 11.6–15.9)
LYMPH#: 1.5 10*3/uL (ref 0.9–3.3)
MONO#: 0.7 10*3/uL (ref 0.1–0.9)
NEUT#: 2.8 10*3/uL (ref 1.5–6.5)
RBC: UNDETERMINED 10*6/uL (ref 3.70–5.32)

## 2011-07-23 ENCOUNTER — Telehealth: Payer: Self-pay | Admitting: *Deleted

## 2011-07-23 NOTE — Telephone Encounter (Signed)
Moved 11-19 to 11-21

## 2011-07-26 ENCOUNTER — Other Ambulatory Visit: Payer: PRIVATE HEALTH INSURANCE | Admitting: Lab

## 2011-07-26 ENCOUNTER — Ambulatory Visit: Payer: PRIVATE HEALTH INSURANCE

## 2011-07-28 ENCOUNTER — Ambulatory Visit (HOSPITAL_BASED_OUTPATIENT_CLINIC_OR_DEPARTMENT_OTHER): Payer: PRIVATE HEALTH INSURANCE

## 2011-07-28 ENCOUNTER — Other Ambulatory Visit: Payer: PRIVATE HEALTH INSURANCE | Admitting: Lab

## 2011-07-28 ENCOUNTER — Other Ambulatory Visit: Payer: Self-pay | Admitting: Family

## 2011-07-28 ENCOUNTER — Other Ambulatory Visit: Payer: Self-pay | Admitting: Hematology & Oncology

## 2011-07-28 VITALS — BP 148/70 | HR 68

## 2011-07-28 DIAGNOSIS — N189 Chronic kidney disease, unspecified: Secondary | ICD-10-CM

## 2011-07-28 DIAGNOSIS — N039 Chronic nephritic syndrome with unspecified morphologic changes: Secondary | ICD-10-CM

## 2011-07-28 DIAGNOSIS — D631 Anemia in chronic kidney disease: Secondary | ICD-10-CM

## 2011-07-28 DIAGNOSIS — D500 Iron deficiency anemia secondary to blood loss (chronic): Secondary | ICD-10-CM

## 2011-07-28 LAB — CBC WITH DIFFERENTIAL (CANCER CENTER ONLY)
BASO%: 0.5 % (ref 0.0–2.0)
EOS%: 5.3 % (ref 0.0–7.0)
HCT: UNDETERMINED % (ref 34.8–46.6)
LYMPH#: 1.6 10*3/uL (ref 0.9–3.3)
MCHC: UNDETERMINED g/dL (ref 32.0–36.0)
NEUT#: 2.8 10*3/uL (ref 1.5–6.5)
NEUT%: 51.7 % (ref 39.6–80.0)
Platelets: 268 10*3/uL (ref 145–400)
RDW: UNDETERMINED % (ref 11.1–15.7)

## 2011-07-28 MED ORDER — DARBEPOETIN ALFA-POLYSORBATE 500 MCG/ML IJ SOLN
300.0000 ug | Freq: Once | INTRAMUSCULAR | Status: AC
  Administered 2011-07-28: 300 ug via SUBCUTANEOUS
  Filled 2011-07-28: qty 1

## 2011-09-22 ENCOUNTER — Other Ambulatory Visit: Payer: Self-pay | Admitting: *Deleted

## 2011-09-27 ENCOUNTER — Other Ambulatory Visit: Payer: PRIVATE HEALTH INSURANCE | Admitting: Lab

## 2011-09-27 ENCOUNTER — Ambulatory Visit (HOSPITAL_BASED_OUTPATIENT_CLINIC_OR_DEPARTMENT_OTHER): Payer: PRIVATE HEALTH INSURANCE | Admitting: Hematology & Oncology

## 2011-09-27 ENCOUNTER — Ambulatory Visit (HOSPITAL_BASED_OUTPATIENT_CLINIC_OR_DEPARTMENT_OTHER): Payer: PRIVATE HEALTH INSURANCE

## 2011-09-27 VITALS — BP 123/74 | HR 75 | Temp 98.0°F

## 2011-09-27 DIAGNOSIS — L089 Local infection of the skin and subcutaneous tissue, unspecified: Secondary | ICD-10-CM

## 2011-09-27 DIAGNOSIS — N289 Disorder of kidney and ureter, unspecified: Secondary | ICD-10-CM

## 2011-09-27 DIAGNOSIS — D649 Anemia, unspecified: Secondary | ICD-10-CM

## 2011-09-27 LAB — CBC WITH DIFFERENTIAL (CANCER CENTER ONLY)
BASO%: 0.4 % (ref 0.0–2.0)
EOS%: 5.5 % (ref 0.0–7.0)
LYMPH#: 1.8 10*3/uL (ref 0.9–3.3)
MCH: UNDETERMINED pg (ref 26.0–34.0)
MCHC: UNDETERMINED g/dL (ref 32.0–36.0)
MONO%: 11.5 % (ref 0.0–13.0)
NEUT#: 2.1 10*3/uL (ref 1.5–6.5)

## 2011-09-27 MED ORDER — DARBEPOETIN ALFA-POLYSORBATE 300 MCG/0.6ML IJ SOLN
300.0000 ug | Freq: Once | INTRAMUSCULAR | Status: AC
  Administered 2011-09-27: 300 ug via SUBCUTANEOUS

## 2011-09-27 MED ORDER — MUPIROCIN CALCIUM 2 % EX CREA: TOPICAL_CREAM | Freq: Three times a day (TID) | CUTANEOUS | Status: AC

## 2011-09-27 NOTE — Progress Notes (Signed)
This office note has been dictated.

## 2011-09-27 NOTE — Progress Notes (Signed)
CC:   [name], M.D.  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  CURRENT THERAPY: 1. Folic acid 2 mg p.o. daily. 2. Aranesp 300 mcg subcu as needed for hemoglobin less than 10.  INTERIM HISTORY:  [name] comes in for followup.  She is doing about as well as one could expect.  She is 78 years old.  She really does not do much anymore.  She still is living at home.  She has not noted any problems with pain.  There has been no bleeding. Her appetite comes and goes.  During the wintertime, her blood count always gets worse.  This is no surprise as the cold agglutinins are "activated."  Again, she last got Aranesp back in November.  PHYSICAL EXAM:  General:  This is an elderly but fairly well-nourished black female in no obvious distress.  Vital Signs:  Temperature of 98, pulse 75, respiratory rate 18, blood pressure 123/74.  Weight was not taken.  Head/Neck:  Exam shows a normocephalic, atraumatic skull.  There are no ocular or oral lesions.  There are no palpable cervical or supraclavicular lymph nodes.  Lungs:  Clear to percussion and auscultation bilaterally.  Cardiac:  Regular rate and rhythm with a normal S1, S2.  There are no murmurs, rubs or bruits.  Abdomen: Soft with good bowel sounds.  There is no palpable abdominal mass.  There is no fluid wave.  There is no palpable hepatosplenomegaly.  Extremities: No clubbing, cyanosis or edema.  Neurologic:  Exam shows no focal neurological deficit.  LABORATORY STUDIES:  White cell count 4.8, hemoglobin 8.9, platelet count 164.  IMPRESSION:  [name] is a 78 year old African American female with cold agglutinin disease.  She is living with this.  She is not going to die from this.  We will go ahead and give her a dose of Aranesp today. I will plan to get her back to see me in another 2-3 months.   ______________________________ Josph Macho,  M.D. PRE/MEDQ  D:  09/27/2011  T:  09/27/2011  Job:  [id number]

## 2011-10-25 ENCOUNTER — Telehealth: Payer: Self-pay | Admitting: Hematology & Oncology

## 2011-10-25 ENCOUNTER — Ambulatory Visit: Payer: PRIVATE HEALTH INSURANCE

## 2011-10-25 ENCOUNTER — Other Ambulatory Visit: Payer: PRIVATE HEALTH INSURANCE | Admitting: Lab

## 2011-10-25 NOTE — Telephone Encounter (Signed)
Patients daugther moved 2-18 to 2-25

## 2011-11-01 ENCOUNTER — Other Ambulatory Visit (HOSPITAL_BASED_OUTPATIENT_CLINIC_OR_DEPARTMENT_OTHER): Payer: PRIVATE HEALTH INSURANCE | Admitting: Lab

## 2011-11-01 ENCOUNTER — Ambulatory Visit (HOSPITAL_BASED_OUTPATIENT_CLINIC_OR_DEPARTMENT_OTHER): Payer: PRIVATE HEALTH INSURANCE

## 2011-11-01 VITALS — BP 150/78 | HR 64 | Temp 95.5°F

## 2011-11-01 DIAGNOSIS — D500 Iron deficiency anemia secondary to blood loss (chronic): Secondary | ICD-10-CM

## 2011-11-01 DIAGNOSIS — D649 Anemia, unspecified: Secondary | ICD-10-CM

## 2011-11-01 DIAGNOSIS — N189 Chronic kidney disease, unspecified: Secondary | ICD-10-CM

## 2011-11-01 LAB — CBC WITH DIFFERENTIAL (CANCER CENTER ONLY)
BASO#: 0 10*3/uL (ref 0.0–0.2)
EOS%: 5.1 % (ref 0.0–7.0)
Eosinophils Absolute: 0.4 10*3/uL (ref 0.0–0.5)
HCT: UNDETERMINED % (ref 34.8–46.6)
HGB: 10.1 g/dL — ABNORMAL LOW (ref 11.6–15.9)
LYMPH#: 2.4 10*3/uL (ref 0.9–3.3)
MCHC: UNDETERMINED g/dL (ref 32.0–36.0)
MONO#: 0.7 10*3/uL (ref 0.1–0.9)
NEUT%: 53.6 % (ref 39.6–80.0)

## 2011-11-01 MED ORDER — DARBEPOETIN ALFA-POLYSORBATE 300 MCG/0.6ML IJ SOLN
300.0000 ug | Freq: Once | INTRAMUSCULAR | Status: AC
  Administered 2011-11-01: 300 ug via SUBCUTANEOUS

## 2011-11-15 ENCOUNTER — Ambulatory Visit: Payer: PRIVATE HEALTH INSURANCE

## 2011-11-15 ENCOUNTER — Ambulatory Visit (HOSPITAL_BASED_OUTPATIENT_CLINIC_OR_DEPARTMENT_OTHER): Payer: PRIVATE HEALTH INSURANCE | Admitting: Hematology & Oncology

## 2011-11-15 ENCOUNTER — Other Ambulatory Visit (HOSPITAL_BASED_OUTPATIENT_CLINIC_OR_DEPARTMENT_OTHER): Payer: PRIVATE HEALTH INSURANCE | Admitting: Lab

## 2011-11-15 DIAGNOSIS — D649 Anemia, unspecified: Secondary | ICD-10-CM

## 2011-11-15 LAB — CBC WITH DIFFERENTIAL (CANCER CENTER ONLY)
BASO#: 0 10*3/uL (ref 0.0–0.2)
Eosinophils Absolute: 0.3 10*3/uL (ref 0.0–0.5)
HCT: UNDETERMINED % (ref 34.8–46.6)
HGB: 9.9 g/dL — ABNORMAL LOW (ref 11.6–15.9)
MCH: UNDETERMINED pg (ref 26.0–34.0)
MCHC: UNDETERMINED g/dL (ref 32.0–36.0)
MCV: UNDETERMINED fL (ref 81–101)
MONO%: 12.7 % (ref 0.0–13.0)
NEUT#: 2.6 10*3/uL (ref 1.5–6.5)
NEUT%: 49.3 % (ref 39.6–80.0)
RBC: UNDETERMINED 10*6/uL (ref 3.70–5.32)

## 2011-11-15 NOTE — Progress Notes (Signed)
This office note has been dictated.

## 2011-11-15 NOTE — Progress Notes (Unsigned)
Pt did not get her injection today. I look under the lab and pt did not have any lab work done.

## 2011-11-15 NOTE — Progress Notes (Signed)
CC:   [name], M.D.  DIAGNOSES: 1. Cold agglutinin hemolytic anemia. 2. Anemia secondary to chronic renal insufficiency.  CURRENT THERAPY: 1. Folic acid 2 mg p.o. daily. 2. Aranesp 300 mcg subcutaneous as needed for hemoglobin less than 10.  INTERIM HISTORY:  [name] comes in for her followup.  We usually see her every couple of months.  She has been doing okay.  She is 78 years old.  She is making it all right.  Usually, her blood counts get better with the warmer weather that comes with spring and summer.  She seems to be eating okay.  She is really not hurting.  She has had no problems being in the hospital.  She has had no change in bowel or bladder habits.  PHYSICAL EXAM:  General: This is a elderly, feeble, black female in no obvious distress.  Vital signs: Show a temperature of 98.5, pulse 69, respiratory 18, and blood pressure 119/68.  Head and neck exam shows a normocephalic, atraumatic skull.  There are no ocular or oral lesions. There is no scleral icterus.  Lungs are clear bilaterally.  Cardiac examination:  Regular rate and rhythm with a normal S1 and S2.  There are no murmurs, rubs, or bruits.  Abdominal examination:  Soft with good bowel sounds.  There is no fluid wave.  There is no guarding or rebound tenderness.  There is no palpable hepatosplenomegaly.  Extremities: Shows no clubbing, cyanosis, or edema.  Neurological exam: Shows no focal neurological deficits.  LABORATORY STUDIES:  White cell count is 5.3, hemoglobin 9.9, and platelet count 180.  IMPRESSION:  [name] is a 78 year old African American female with a cold agglutinin hemolytic anemia.  This was holding quite steady.  I really do not think we need to give her an Aranesp injection today. She is asymptomatic.  Again, hopefully, with the weather, her cold agglutinins will become suppressed and that she will have an improvement in her blood count.  I do want to see her back in  about 6-week's time for followup.   ______________________________ Josph Macho, M.D. PRE/MEDQ  D:  11/15/2011  T:  11/15/2011  Job:  [id number]

## 2011-11-18 LAB — IRON AND TIBC

## 2011-12-23 ENCOUNTER — Ambulatory Visit (HOSPITAL_BASED_OUTPATIENT_CLINIC_OR_DEPARTMENT_OTHER): Payer: PRIVATE HEALTH INSURANCE

## 2011-12-23 ENCOUNTER — Other Ambulatory Visit: Payer: Self-pay | Admitting: *Deleted

## 2011-12-23 ENCOUNTER — Ambulatory Visit (HOSPITAL_BASED_OUTPATIENT_CLINIC_OR_DEPARTMENT_OTHER): Payer: PRIVATE HEALTH INSURANCE | Admitting: Hematology & Oncology

## 2011-12-23 ENCOUNTER — Other Ambulatory Visit (HOSPITAL_BASED_OUTPATIENT_CLINIC_OR_DEPARTMENT_OTHER): Payer: PRIVATE HEALTH INSURANCE | Admitting: Lab

## 2011-12-23 ENCOUNTER — Encounter (HOSPITAL_COMMUNITY)
Admission: RE | Admit: 2011-12-23 | Discharge: 2011-12-23 | Disposition: A | Discharge status: Home / Self Care | Payer: PRIVATE HEALTH INSURANCE | Source: Ambulatory Visit | Attending: Hematology & Oncology | Admitting: Hematology & Oncology

## 2011-12-23 VITALS — BP 111/63 | HR 74 | Temp 97.8°F

## 2011-12-23 DIAGNOSIS — D500 Iron deficiency anemia secondary to blood loss (chronic): Secondary | ICD-10-CM

## 2011-12-23 DIAGNOSIS — D588 Other specified hereditary hemolytic anemias: Secondary | ICD-10-CM | POA: Insufficient documentation

## 2011-12-23 DIAGNOSIS — N289 Disorder of kidney and ureter, unspecified: Secondary | ICD-10-CM | POA: Insufficient documentation

## 2011-12-23 DIAGNOSIS — D591 Autoimmune hemolytic anemia, unspecified: Secondary | ICD-10-CM

## 2011-12-23 DIAGNOSIS — D649 Anemia, unspecified: Secondary | ICD-10-CM | POA: Insufficient documentation

## 2011-12-23 DIAGNOSIS — D599 Acquired hemolytic anemia, unspecified: Secondary | ICD-10-CM

## 2011-12-23 LAB — CBC WITH DIFFERENTIAL/PLATELET
BASO%: 1.2 % (ref 0.0–2.0)
EOS%: 4.1 % (ref 0.0–7.0)
LYMPH%: 14.2 % (ref 14.0–49.7)
MCH: UNDETERMINED pg (ref 25.1–34.0)
MCHC: UNDETERMINED g/dL (ref 31.5–36.0)
MONO#: 0.5 10*3/uL (ref 0.1–0.9)
Platelets: 174 10*3/uL (ref 145–400)
RBC: UNDETERMINED 10*6/uL (ref 3.70–5.45)
WBC: 7.1 10*3/uL (ref 3.9–10.3)
lymph#: 1 10*3/uL (ref 0.9–3.3)
nRBC: 0 % (ref 0–0)

## 2011-12-23 NOTE — Progress Notes (Signed)
This office note has been dictated.

## 2011-12-23 NOTE — Progress Notes (Signed)
error 

## 2011-12-23 NOTE — Progress Notes (Signed)
See ESA report

## 2011-12-24 ENCOUNTER — Other Ambulatory Visit: Payer: PRIVATE HEALTH INSURANCE | Admitting: Lab

## 2011-12-24 NOTE — Progress Notes (Signed)
CC:   Thora Lance, M.D.  DIAGNOSES: 1. Cold agglutinin hemolytic anemia. 2. Anemia secondary to renal insufficiency.  CURRENT THERAPY: 1. Aranesp 300 mcg subcu as needed for hemoglobin less than 10. 2. Folic acid 2 mg p.o. daily.  INTERVAL HISTORY:  Ms. Kupfer comes in for followup.  She is now 78 years old.  She is still hanging in as best as possible.  She really does not do all that much, but then again, she does not have to do anything.  She, unfortunately, had her blood drawn today and it was quite "thick." We could not get a CBC on it, so we are going to have to send it out to see about her hemoglobin.  Her appetite seems to be doing okay.  She has had no nausea or vomiting.  Her last cold agglutinin titer was 1:20,480.  PHYSICAL EXAMINATION:  This is an elderly, frail black female in no obvious distress.  Vital signs:  Temperature of 97.8, pulse 84, respiratory rate 16, blood pressure 111/63.  Head and neck exam shows no scleral icterus.  There is no oral lesions.  There is no adenopathy in her neck.  Lungs:  Clear bilaterally.  Cardiac:  Regular rate and rhythm with a normal S1 and S2.  She has a 1/6 systolic ejection murmur. Abdomen:  Soft with good bowel sounds.  There is no palpable abdominal mass.  There is no palpable hepatosplenomegaly.  Extremities show age- related osteoporosis.  She has some muscle atrophy bilaterally.  IMPRESSION:  Ms. Depoy is a 78 year old African American female with cold agglutinin disease.  Again, she typically responds well to Aranesp when necessary.  We could not give her any Aranesp today because we do not have her blood work back.  Will go ahead and get her back in about 3 or 4 weeks.  Hopefully, with the warmer weather coming, her cold agglutinin titer will dilute down and her hemoglobin will improve.   ______________________________ Josph Macho, M.D. PRE/MEDQ  D:  12/23/2011  T:  12/24/2011  Job:  [id number]

## 2011-12-27 ENCOUNTER — Ambulatory Visit (HOSPITAL_BASED_OUTPATIENT_CLINIC_OR_DEPARTMENT_OTHER): Payer: PRIVATE HEALTH INSURANCE

## 2011-12-27 VITALS — BP 127/80 | HR 67 | Temp 97.0°F | Resp 16

## 2011-12-27 DIAGNOSIS — D500 Iron deficiency anemia secondary to blood loss (chronic): Secondary | ICD-10-CM

## 2011-12-27 MED ORDER — ACETAMINOPHEN 325 MG PO TABS
650.0000 mg | ORAL_TABLET | Freq: Once | ORAL | Status: AC
  Administered 2011-12-27: 650 mg via ORAL

## 2011-12-27 MED ORDER — SODIUM CHLORIDE 0.9 % IV SOLN
250.0000 mL | Freq: Once | INTRAVENOUS | Status: AC
  Administered 2011-12-27: 250 mL via INTRAVENOUS

## 2011-12-27 MED ORDER — DIPHENHYDRAMINE HCL 25 MG PO CAPS
25.0000 mg | ORAL_CAPSULE | Freq: Once | ORAL | Status: AC
Start: 2011-12-27 — End: 2011-12-27
  Administered 2011-12-27: 25 mg via ORAL

## 2011-12-27 MED ORDER — FUROSEMIDE 10 MG/ML IJ SOLN
10.0000 mg | Freq: Once | INTRAMUSCULAR | Status: AC
  Administered 2011-12-27: 10 mg via INTRAMUSCULAR

## 2011-12-27 NOTE — Patient Instructions (Signed)
Blood Transfusion   A blood transfusion replaces your blood or some of its parts. Blood is replaced when you have lost blood because of surgery, an accident, or for severe blood conditions like anemia.  You can donate blood to be used on yourself if you have a planned surgery. If you lose blood during that surgery, your own blood can be given back to you.  Any blood given to you is checked to make sure it matches your blood type. Your temperature, blood pressure, and heart rate (vital signs) will be checked often.   GET HELP RIGHT AWAY IF:    You feel sick to your stomach (nauseous) or throw up (vomit).   You have watery poop (diarrhea).   You have shortness of breath or trouble breathing.   You have blood in your pee (urine) or have dark colored pee.   You have chest pain or tightness.   Your eyes or skin turn yellow (jaundice).   You have a temperature by mouth above 102 F (38.9 C), not controlled by medicine.   You start to shake and have chills.   You develop a a red rash (hives) or feel itchy.   You develop lightheadedness or feel confused.   You develop back, joint, or muscle pain.   You do not feel hungry (lost appetite).   You feel tired, restless, or nervous.   You develop belly (abdominal) cramps.  Document Released: 11/19/2008 Document Revised: 08/12/2011 Document Reviewed: 11/19/2008  Exit[employer] Patient Information 2012 [employer].

## 2011-12-28 ENCOUNTER — Encounter: Payer: Self-pay | Admitting: Hematology & Oncology

## 2011-12-28 LAB — TYPE AND SCREEN
ABO/RH(D): B POS
Antibody Screen: POSITIVE
Unit division: 0

## 2011-12-29 LAB — COLD AGGLUTININ TITER: Cold Agglutinin Titer: 1:10240 {titer} — AB

## 2012-01-07 ENCOUNTER — Encounter (HOSPITAL_COMMUNITY)
Admission: RE | Admit: 2012-01-07 | Discharge: 2012-01-07 | Disposition: A | Discharge status: Home / Self Care | Payer: PRIVATE HEALTH INSURANCE | Source: Ambulatory Visit | Attending: Hematology & Oncology | Admitting: Hematology & Oncology

## 2012-01-07 DIAGNOSIS — D588 Other specified hereditary hemolytic anemias: Secondary | ICD-10-CM | POA: Insufficient documentation

## 2012-01-07 DIAGNOSIS — D649 Anemia, unspecified: Secondary | ICD-10-CM | POA: Insufficient documentation

## 2012-01-07 DIAGNOSIS — N289 Disorder of kidney and ureter, unspecified: Secondary | ICD-10-CM | POA: Insufficient documentation

## 2012-01-11 ENCOUNTER — Ambulatory Visit (HOSPITAL_BASED_OUTPATIENT_CLINIC_OR_DEPARTMENT_OTHER): Payer: PRIVATE HEALTH INSURANCE | Admitting: Hematology & Oncology

## 2012-01-11 ENCOUNTER — Ambulatory Visit (HOSPITAL_BASED_OUTPATIENT_CLINIC_OR_DEPARTMENT_OTHER): Payer: PRIVATE HEALTH INSURANCE | Admitting: Lab

## 2012-01-11 ENCOUNTER — Ambulatory Visit: Payer: PRIVATE HEALTH INSURANCE

## 2012-01-11 DIAGNOSIS — D500 Iron deficiency anemia secondary to blood loss (chronic): Secondary | ICD-10-CM

## 2012-01-11 DIAGNOSIS — D649 Anemia, unspecified: Secondary | ICD-10-CM

## 2012-01-11 LAB — CBC WITH DIFFERENTIAL (CANCER CENTER ONLY)
BASO%: 0.8 % (ref 0.0–2.0)
LYMPH%: 30.1 % (ref 14.0–48.0)
MCV: UNDETERMINED fL (ref 81–101)
MONO#: 0.6 10*3/uL (ref 0.1–0.9)
NEUT#: 3.2 10*3/uL (ref 1.5–6.5)
Platelets: 206 10*3/uL (ref 145–400)
RDW: UNDETERMINED % (ref 11.1–15.7)
WBC: 6.3 10*3/uL (ref 3.9–10.0)

## 2012-01-11 LAB — CHCC SATELLITE - SMEAR

## 2012-01-11 NOTE — Progress Notes (Signed)
This office note has been dictated.

## 2012-01-11 NOTE — Progress Notes (Signed)
CC:   [name], M.D.  DIAGNOSES: 1. Cold agglutinin hemolytic anemia. 2. Anemia secondary to chronic renal insufficiency.  CURRENT THERAPY: 1. Folic acid 2 mg p.o. daily. 2. Aranesp 300 mcg subcu as needed for hemoglobin less than 10.  INTERIM HISTORY:  [name] comes in for her followup.  She was transfused with blood a couple weeks ago.  Her hemoglobin at that point in time was down to 7.1.  I felt that we needed to transfuse her.  She got her transfusion I think on April 19.  She is doing okay.  Hopefully, we will finally get some warmer weather so that her cold agglutinins will "slow down".  Her last cold agglutinin titer was 1:10,240.  She is eating okay.  She is 78 years old so she really does not do all that much.  She does have occasional bony pain.  She has had no fevers.  PHYSICAL EXAMINATION:  General:  This is an elderly, frail black female in no obvious distress.  Vital signs:  Show a temperature of 97.6, pulse 65, respiratory rate 18, blood pressure 121/75.  Weight was not taken. Head and neck:  Exam shows a normocephalic, atraumatic skull.  There are no ocular or oral lesions.  There are no palpable cervical or supraclavicular lymph nodes.  Lungs:  Clear bilaterally.  She may have some slight decreases at the bases.  Cardiac:  Regular rate and rhythm with an occasional extra beat.  There are no murmurs, rubs or bruits. Abdomen:  Soft with good bowel sounds.  There is no palpable abdominal mass.  There is no fluid wave.  There is no palpable hepatosplenomegaly. Extremities:  Shows no clubbing, cyanosis or edema.  LABORATORY STUDIES:  White cell count 6.3, hemoglobin 10.7, platelet count 206.  IMPRESSION:  [name] is a 78 year old African American female with cold agglutinin hemolytic anemia.  She actually has done very well with this.  We will go ahead and plan to have her come back to see [place] in about a month.  She does not need any Aranesp  today.   ______________________________ Josph Macho, M.D. PRE/MEDQ  D:  01/11/2012  T:  01/11/2012  Job:  [id number]

## 2012-01-14 LAB — COLD AGGLUTININ TITER: Cold Agglutinin Titer: 1:20480 {titer} — AB

## 2012-02-05 ENCOUNTER — Encounter (HOSPITAL_COMMUNITY)
Admission: RE | Admit: 2012-02-05 | Discharge: 2012-02-05 | Disposition: A | Discharge status: Home / Self Care | Payer: PRIVATE HEALTH INSURANCE | Source: Ambulatory Visit | Attending: Hematology & Oncology | Admitting: Hematology & Oncology

## 2012-02-05 DIAGNOSIS — N289 Disorder of kidney and ureter, unspecified: Secondary | ICD-10-CM | POA: Insufficient documentation

## 2012-02-05 DIAGNOSIS — D588 Other specified hereditary hemolytic anemias: Secondary | ICD-10-CM | POA: Insufficient documentation

## 2012-02-05 DIAGNOSIS — D649 Anemia, unspecified: Secondary | ICD-10-CM | POA: Insufficient documentation

## 2012-02-10 ENCOUNTER — Other Ambulatory Visit (HOSPITAL_BASED_OUTPATIENT_CLINIC_OR_DEPARTMENT_OTHER): Payer: PRIVATE HEALTH INSURANCE | Admitting: Lab

## 2012-02-10 ENCOUNTER — Ambulatory Visit: Payer: PRIVATE HEALTH INSURANCE

## 2012-02-10 ENCOUNTER — Other Ambulatory Visit: Payer: PRIVATE HEALTH INSURANCE | Admitting: Lab

## 2012-02-10 VITALS — BP 110/64 | HR 80 | Temp 97.7°F

## 2012-02-10 DIAGNOSIS — D500 Iron deficiency anemia secondary to blood loss (chronic): Secondary | ICD-10-CM

## 2012-02-10 LAB — CBC WITH DIFFERENTIAL (CANCER CENTER ONLY)
BASO#: 0 10*3/uL (ref 0.0–0.2)
HCT: UNDETERMINED % (ref 34.8–46.6)
HGB: 10.7 g/dL — ABNORMAL LOW (ref 11.6–15.9)
LYMPH#: 1.7 10*3/uL (ref 0.9–3.3)
MCHC: UNDETERMINED g/dL (ref 32.0–36.0)
MONO#: 0.5 10*3/uL (ref 0.1–0.9)
NEUT%: 61.2 % (ref 39.6–80.0)
WBC: 7.1 10*3/uL (ref 3.9–10.0)

## 2012-02-10 MED ORDER — DARBEPOETIN ALFA-POLYSORBATE 300 MCG/0.6ML IJ SOLN: 300.0000 ug | Freq: Once | INTRAMUSCULAR | Status: DC

## 2012-03-07 ENCOUNTER — Encounter (HOSPITAL_COMMUNITY)
Admission: RE | Admit: 2012-03-07 | Discharge: 2012-03-07 | Disposition: A | Discharge status: Home / Self Care | Payer: PRIVATE HEALTH INSURANCE | Source: Ambulatory Visit | Attending: Hematology & Oncology | Admitting: Hematology & Oncology

## 2012-03-07 DIAGNOSIS — N289 Disorder of kidney and ureter, unspecified: Secondary | ICD-10-CM | POA: Insufficient documentation

## 2012-03-07 DIAGNOSIS — D588 Other specified hereditary hemolytic anemias: Secondary | ICD-10-CM | POA: Insufficient documentation

## 2012-03-07 DIAGNOSIS — D649 Anemia, unspecified: Secondary | ICD-10-CM | POA: Insufficient documentation

## 2012-03-08 ENCOUNTER — Other Ambulatory Visit: Payer: PRIVATE HEALTH INSURANCE | Admitting: Lab

## 2012-03-08 ENCOUNTER — Ambulatory Visit: Payer: PRIVATE HEALTH INSURANCE

## 2012-03-08 ENCOUNTER — Ambulatory Visit: Payer: PRIVATE HEALTH INSURANCE | Admitting: Hematology & Oncology

## 2012-03-10 ENCOUNTER — Other Ambulatory Visit (HOSPITAL_BASED_OUTPATIENT_CLINIC_OR_DEPARTMENT_OTHER): Payer: PRIVATE HEALTH INSURANCE | Admitting: Lab

## 2012-03-10 ENCOUNTER — Ambulatory Visit (HOSPITAL_BASED_OUTPATIENT_CLINIC_OR_DEPARTMENT_OTHER): Payer: PRIVATE HEALTH INSURANCE | Admitting: Hematology & Oncology

## 2012-03-10 ENCOUNTER — Ambulatory Visit: Payer: PRIVATE HEALTH INSURANCE

## 2012-03-10 VITALS — BP 103/60 | HR 73 | Temp 98.6°F | Ht 64.0 in

## 2012-03-10 LAB — CBC WITH DIFFERENTIAL (CANCER CENTER ONLY)
BASO#: 0 10*3/uL (ref 0.0–0.2)
EOS%: 8.1 % — ABNORMAL HIGH (ref 0.0–7.0)
HCT: UNDETERMINED % (ref 34.8–46.6)
HGB: 10.3 g/dL — ABNORMAL LOW (ref 11.6–15.9)
MCH: UNDETERMINED pg (ref 26.0–34.0)
MCHC: UNDETERMINED g/dL (ref 32.0–36.0)
MCV: UNDETERMINED fL (ref 81–101)
MONO%: 11 % (ref 0.0–13.0)
NEUT%: 51.6 % (ref 39.6–80.0)

## 2012-03-10 NOTE — Progress Notes (Signed)
This office note has been dictated.

## 2012-03-11 NOTE — Progress Notes (Signed)
CC:   Thora Lance, M.D.  DIAGNOSES: 1. Cold agglutinin hemolytic anemia. 2. Anemia secondary to renal insufficiency.  CURRENT THERAPY: 1. Folic acid 2 mg p.o. daily. 2. Aranesp 300 mcg subcu as needed for hemoglobin less than 10.  INTERIM HISTORY:  Ms. Delatorre comes in for followup.  She actually is doing fairly well.  I do not think we have had to give her Aranesp now for a good 2 months or so.  The warm weather clearly does help her blood count quite a bit.  The last time we gave her an Aranesp was on 02/10/2012.  I am not sure why she did get Aranesp as her hemoglobin at that point in time was 10.7.  She has had a decent appetite.  She sleeps most of the time.  She does not appear to be hurting.  There is no nausea or vomiting.  She has had no change in bowel or bladder habits.  PHYSICAL EXAMINATION:  This is am elderly, frail black female in no obvious distress.  Vital signs:  98.6, pulse 73, respiratory rate 20, blood pressure 103/60.  Head and neck:  Normocephalic, atraumatic skull. There are no ocular or oral lesions.  There are no palpable cervical or supraclavicular lymph nodes.  Lungs:  Clear bilaterally.  She may have slight decrease at the bases.  Cardiac:  Regular rate and rhythm with a normal S1 and S2.  There are no murmurs, rubs or bruits.  Abdomen:  Soft with good bowel sounds.  There is no palpable abdominal mass.  There is no fluid wave.  There is no palpable hepatosplenomegaly.  Back:  No tenderness over the spine, ribs, or hips.  Extremities:  No clubbing, cyanosis or edema.  LABORATORY STUDIES:  White cell count is 4.7, hemoglobin 10.3, platelet count 159.  IMPRESSION:  Ms. 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 The warm weather clearly benefits her.  She does not need any Aranesp today.  She does not need Aranesp unless her hemoglobin is less than 10.  We  will go ahead and plan to get her back to see [place] in another 6 weeks or so.   ______________________________ Josph Macho, M.D. PRE/MEDQ  D:  03/10/2012  T:  03/10/2012  Job:  [id number]

## 2012-04-06 ENCOUNTER — Encounter (HOSPITAL_COMMUNITY)
Admission: RE | Admit: 2012-04-06 | Discharge: 2012-04-06 | Disposition: A | Discharge status: Home / Self Care | Payer: PRIVATE HEALTH INSURANCE | Source: Ambulatory Visit | Attending: Hematology & Oncology | Admitting: Hematology & Oncology

## 2012-04-06 DIAGNOSIS — D588 Other specified hereditary hemolytic anemias: Secondary | ICD-10-CM | POA: Insufficient documentation

## 2012-04-06 DIAGNOSIS — N289 Disorder of kidney and ureter, unspecified: Secondary | ICD-10-CM | POA: Insufficient documentation

## 2012-04-06 DIAGNOSIS — D649 Anemia, unspecified: Secondary | ICD-10-CM | POA: Insufficient documentation

## 2012-04-21 ENCOUNTER — Other Ambulatory Visit (HOSPITAL_BASED_OUTPATIENT_CLINIC_OR_DEPARTMENT_OTHER): Payer: PRIVATE HEALTH INSURANCE | Admitting: Lab

## 2012-04-21 ENCOUNTER — Ambulatory Visit (HOSPITAL_BASED_OUTPATIENT_CLINIC_OR_DEPARTMENT_OTHER): Payer: PRIVATE HEALTH INSURANCE | Admitting: Medical

## 2012-04-21 VITALS — BP 142/81 | HR 60 | Temp 97.0°F | Resp 18 | Ht 64.0 in

## 2012-04-21 DIAGNOSIS — D500 Iron deficiency anemia secondary to blood loss (chronic): Secondary | ICD-10-CM

## 2012-04-21 DIAGNOSIS — N289 Disorder of kidney and ureter, unspecified: Secondary | ICD-10-CM

## 2012-04-21 DIAGNOSIS — D649 Anemia, unspecified: Secondary | ICD-10-CM

## 2012-04-21 LAB — CBC WITH DIFFERENTIAL (CANCER CENTER ONLY)
BASO%: 0.7 % (ref 0.0–2.0)
EOS%: 8.2 % — ABNORMAL HIGH (ref 0.0–7.0)
LYMPH#: 2 10*3/uL (ref 0.9–3.3)
MONO#: 0.6 10*3/uL (ref 0.1–0.9)
Platelets: 207 10*3/uL (ref 145–400)
WBC: 5.8 10*3/uL (ref 3.9–10.0)

## 2012-04-21 NOTE — Progress Notes (Signed)
Diagnoses: #1 cold agglutinin hemolytic anemia. #2 anemia, secondary to renal insufficiency.  Current therapy: #1 folic acid 2 mg by mouth daily. #2 Aranesp 300 mcg subcutaneous as needed.  For hemoglobin less than 10.  Interim history: [name] presents today for an office followup visit.  Overall, she is doing fairly well for her age.  Her hemoglobin is holding steady at 10.2.  As such, she will not need an Aranesp injection.  She continues to remain at home with her daughter and home health aide.  She's not reported any new problems since the last time, we saw her.  This warm weather, clearly does help her blood count.  Her quite a bit.  She has a good appetite.  She does sleep majority of the time.  She does not appear to be hurting.  She's not complain of nausea, vomiting, diarrhea, constipation, chest pain, shortness of breath, cough, fevers, chills, or night sweats.  Overall, she seems to be getting along quite well.  Review of Systems: Pt. Denies any changes in their vision, hearing, adenopathy, fevers, chills, nausea, vomiting, diarrhea, constipation, chest pain, shortness of breath, passing blood, passing out, blacking out,  any changes in skin, joints, neurologic or psychiatric except as noted.  Physical Exam: This is a chronically ill-appearing 78 year old, African American, female, in no obvious distress Vitals: Temperature 97.0 degrees, pulse 60, respirations 18, blood pressure 142/81, weight was not taken today.  As patient is in a wheelchair  HEENT reveals a normocephalic, atraumatic skull, no scleral icterus, no oral lesions  Neck is supple without any cervical or supraclavicular adenopathy.  Lungs are clear to auscultation bilaterally. There are no wheezes, rales or rhonci Cardiac is regular rate and rhythm with a normal S1 and S2. There are no murmurs, rubs, or bruits.  Abdomen is soft with good bowel sounds, there is no palpable mass. There is no palpable  hepatosplenomegaly. There is no palpable fluid wave.  Musculoskeletal no tenderness of the spine, ribs, or hips.  Extremities there are no clubbing, cyanosis, or edema.  Skin no petechia, purpura or ecchymosis Neurologic is nonfocal.  Laboratory Data: White count 5.8 hemoglobin 10.2, platelets 207,000  Current Outpatient Prescriptions on File Prior to Visit  Medication Sig Dispense Refill  . acetaminophen (TYLENOL) 325 MG tablet Take 650 mg by mouth every 6 (six) hours as needed.        . cyanocobalamin 1000 MCG tablet Inject 100 mcg into the muscle every 30 (thirty) days.        . folic acid (FOLVITE) 1 MG tablet Take 2 mg by mouth daily.        Marland Kitchen HYDROcodone-acetaminophen (NORCO) 10-325 MG per tablet Take 1 tablet by mouth every 6 (six) hours as needed.      . Multiple Vitamin (MULTIVITAMIN) tablet Take 1 tablet by mouth daily.        . sennosides-docusate sodium (SENOKOT-S) 8.6-50 MG tablet Take 1 tablet by mouth daily.        . timolol (BETIMOL) 0.25 % ophthalmic solution Place 1-2 drops into both eyes 2 (two) times daily.        Marland Kitchen UNKNOWN TO PATIENT Patients assistant was not aware of what medications patient was taking, printed list given to assistant to review with home med's and call us with update.       Assessment/Plan:  This is a pleasant, 78 year old, African American, female, with the following issues: #1 chronic, low-grade hemolytic anemia, secondary to cold agglutinin disease.  She  does not need Aranesp today.  Again, she does not need Aranesp unless her hemoglobin is less than 10.  The warm weather, clearly, benefits , her.  Followup we will plan on seeing [name] in about 6 weeks but before then should there be questions or concerns.

## 2012-05-09 ENCOUNTER — Encounter (HOSPITAL_COMMUNITY)
Admission: RE | Admit: 2012-05-09 | Discharge: 2012-05-09 | Disposition: A | Discharge status: Home / Self Care | Payer: PRIVATE HEALTH INSURANCE | Source: Ambulatory Visit | Attending: Hematology & Oncology | Admitting: Hematology & Oncology

## 2012-05-09 DIAGNOSIS — D588 Other specified hereditary hemolytic anemias: Secondary | ICD-10-CM | POA: Insufficient documentation

## 2012-05-09 DIAGNOSIS — D649 Anemia, unspecified: Secondary | ICD-10-CM | POA: Insufficient documentation

## 2012-05-09 DIAGNOSIS — N289 Disorder of kidney and ureter, unspecified: Secondary | ICD-10-CM | POA: Insufficient documentation

## 2012-06-02 ENCOUNTER — Other Ambulatory Visit: Payer: PRIVATE HEALTH INSURANCE | Admitting: Lab

## 2012-06-02 ENCOUNTER — Ambulatory Visit: Payer: PRIVATE HEALTH INSURANCE | Admitting: Hematology & Oncology

## 2012-06-05 ENCOUNTER — Other Ambulatory Visit (HOSPITAL_BASED_OUTPATIENT_CLINIC_OR_DEPARTMENT_OTHER): Payer: PRIVATE HEALTH INSURANCE | Admitting: Lab

## 2012-06-05 ENCOUNTER — Ambulatory Visit (HOSPITAL_BASED_OUTPATIENT_CLINIC_OR_DEPARTMENT_OTHER): Payer: PRIVATE HEALTH INSURANCE

## 2012-06-05 ENCOUNTER — Ambulatory Visit (HOSPITAL_BASED_OUTPATIENT_CLINIC_OR_DEPARTMENT_OTHER): Payer: PRIVATE HEALTH INSURANCE | Admitting: Medical

## 2012-06-05 VITALS — BP 144/73 | HR 66 | Temp 97.9°F | Resp 18 | Ht 64.0 in

## 2012-06-05 DIAGNOSIS — N289 Disorder of kidney and ureter, unspecified: Secondary | ICD-10-CM

## 2012-06-05 DIAGNOSIS — D500 Iron deficiency anemia secondary to blood loss (chronic): Secondary | ICD-10-CM

## 2012-06-05 DIAGNOSIS — D649 Anemia, unspecified: Secondary | ICD-10-CM

## 2012-06-05 LAB — CBC WITH DIFFERENTIAL (CANCER CENTER ONLY)
BASO#: 0 10*3/uL (ref 0.0–0.2)
BASO%: 0.5 % (ref 0.0–2.0)
HCT: UNDETERMINED % (ref 34.8–46.6)
HGB: 8.8 g/dL — ABNORMAL LOW (ref 11.6–15.9)
LYMPH#: 1.8 10*3/uL (ref 0.9–3.3)
MONO#: 0.5 10*3/uL (ref 0.1–0.9)
NEUT#: 3.6 10*3/uL (ref 1.5–6.5)
NEUT%: 56.9 % (ref 39.6–80.0)
WBC: 6.3 10*3/uL (ref 3.9–10.0)

## 2012-06-05 MED ORDER — DARBEPOETIN ALFA-POLYSORBATE 300 MCG/0.6ML IJ SOLN
300.0000 ug | Freq: Once | INTRAMUSCULAR | Status: AC
  Administered 2012-06-05: 300 ug via SUBCUTANEOUS

## 2012-06-05 NOTE — Progress Notes (Signed)
Diagnoses: #1 cold agglutinin hemolytic anemia. #2 anemia, secondary to renal insufficiency.  Current therapy: #1 folic acid 2 mg by mouth daily. #2 Aranesp 300 mcg subcutaneous as needed.  For hemoglobin less than 10.  Interim history: Ms. Soldo presents today for an office followup visit.  Her aide accompanies her. Overall, she is doing fairly well for her age.  Her hemoglobin is is on the lower side.  Today at 8.8, as such, we will go ahead and give her an Aranesp injection.  She will receive Aranesp 300 mcg.  She does receive Aranesp for hemoglobin less than 10. .  She continues to remain at home with her daughter and home health aide.  She's not reported any new problems since the last time, we saw her.  I do feel that the warmer weather.  We were having clearly helped her blood counts quite a bit.  Now, that we are in the fall, and moving toward colder weather.  I do suspect, that her hemoglobin might remain on the lower side.  She has a good appetite.  She does sleep majority of the time.  She does not appear to be hurting.  She's not complain of nausea, vomiting, diarrhea, constipation, chest pain, shortness of breath, cough, fevers, chills, or night sweats.  Overall, she seems to be getting along quite well.  Review of Systems: Pt. Denies any changes in their vision, hearing, adenopathy, fevers, chills, nausea, vomiting, diarrhea, constipation, chest pain, shortness of breath, passing blood, passing out, blacking out,  any changes in skin, joints, neurologic or psychiatric except as noted.  Physical Exam: This is a chronically ill-appearing 78 year old, African American, female, in no obvious distress Vitals: Temperature 97.5 degrees, pulse 66, respirations 18, blood pressure 144/73 As patient is in a wheelchair  HEENT reveals a normocephalic, atraumatic skull, no scleral icterus, no oral lesions  Neck is supple without any cervical or supraclavicular adenopathy.  Lungs are clear to  auscultation bilaterally. There are no wheezes, rales or rhonci Cardiac is regular rate and rhythm with a normal S1 and S2. There are no murmurs, rubs, or bruits.  Abdomen is soft with good bowel sounds, there is no palpable mass. There is no palpable hepatosplenomegaly. There is no palpable fluid wave.  Musculoskeletal no tenderness of the spine, ribs, or hips.  Extremities there are no clubbing, cyanosis, or edema.  Skin no petechia, purpura or ecchymosis Neurologic is nonfocal.  Laboratory Data: White count 6.3, hemoglobin 8.8, hematocrit unable to determine, platelets 206,000  Current Outpatient Prescriptions on File Prior to Visit  Medication Sig Dispense Refill  . folic acid (FOLVITE) 1 MG tablet Take 2 mg by mouth daily.        Marland Kitchen HYDROcodone-acetaminophen (NORCO) 10-325 MG per tablet Take 1 tablet by mouth every 6 (six) hours as needed.      . Multiple Vitamin (MULTIVITAMIN) tablet Take 1 tablet by mouth daily.        . timolol (TIMOPTIC) 0.5 % ophthalmic solution       . acetaminophen (TYLENOL) 325 MG tablet Take 650 mg by mouth every 6 (six) hours as needed.        . cyanocobalamin 1000 MCG tablet Inject 100 mcg into the muscle every 30 (thirty) days.        . sennosides-docusate sodium (SENOKOT-S) 8.6-50 MG tablet Take 1 tablet by mouth daily.        Marland Kitchen UNKNOWN TO PATIENT Patients assistant was not aware of what medications patient was taking,  printed list given to assistant to review with home med's and call us with update. 06-05-12 again did not bring in medication list and not sure of what she is taking. ST.       Assessment/Plan:  This is a pleasant, 78 year old, African American, female, with the following issues: #1 chronic, low-grade hemolytic anemia, secondary to cold agglutinin disease.  Her hemoglobin is 8.8.  Today.  As, such, we will go ahead and give her an Aranesp injection.  We do give Aranesp for hemoglobin less than 10.  I will have her come back in one month for CBC  check and Aranesp if need be.  Followup we will plan on seeing Ms. Polich in about 6 weeks but before then should there be questions or concerns.

## 2012-06-06 ENCOUNTER — Encounter (HOSPITAL_COMMUNITY)
Admission: RE | Admit: 2012-06-06 | Discharge: 2012-06-06 | Disposition: A | Discharge status: Home / Self Care | Payer: PRIVATE HEALTH INSURANCE | Source: Ambulatory Visit | Attending: Hematology & Oncology | Admitting: Hematology & Oncology

## 2012-06-06 DIAGNOSIS — D649 Anemia, unspecified: Secondary | ICD-10-CM | POA: Insufficient documentation

## 2012-06-06 DIAGNOSIS — D588 Other specified hereditary hemolytic anemias: Secondary | ICD-10-CM | POA: Insufficient documentation

## 2012-06-06 DIAGNOSIS — N289 Disorder of kidney and ureter, unspecified: Secondary | ICD-10-CM | POA: Insufficient documentation

## 2012-06-09 LAB — COLD AGGLUTININ TITER: Cold Agglutinin Titer: 1:20480 {titer} — AB

## 2012-07-03 ENCOUNTER — Ambulatory Visit (HOSPITAL_BASED_OUTPATIENT_CLINIC_OR_DEPARTMENT_OTHER): Payer: PRIVATE HEALTH INSURANCE

## 2012-07-03 ENCOUNTER — Other Ambulatory Visit (HOSPITAL_BASED_OUTPATIENT_CLINIC_OR_DEPARTMENT_OTHER): Payer: PRIVATE HEALTH INSURANCE | Admitting: Lab

## 2012-07-03 VITALS — BP 108/55 | HR 71 | Temp 98.2°F | Resp 20

## 2012-07-03 DIAGNOSIS — D500 Iron deficiency anemia secondary to blood loss (chronic): Secondary | ICD-10-CM

## 2012-07-03 DIAGNOSIS — D649 Anemia, unspecified: Secondary | ICD-10-CM

## 2012-07-03 DIAGNOSIS — N189 Chronic kidney disease, unspecified: Secondary | ICD-10-CM

## 2012-07-03 LAB — CBC WITH DIFFERENTIAL (CANCER CENTER ONLY)
Eosinophils Absolute: 0.3 10*3/uL (ref 0.0–0.5)
HCT: UNDETERMINED % (ref 34.8–46.6)
LYMPH%: 29.7 % (ref 14.0–48.0)
MCH: UNDETERMINED pg (ref 26.0–34.0)
MCV: UNDETERMINED fL (ref 81–101)
MONO#: 0.6 10*3/uL (ref 0.1–0.9)
MONO%: 10.6 % (ref 0.0–13.0)
NEUT%: 54.1 % (ref 39.6–80.0)
Platelets: 248 10*3/uL (ref 145–400)
WBC: 5.6 10*3/uL (ref 3.9–10.0)

## 2012-07-03 MED ORDER — DARBEPOETIN ALFA-POLYSORBATE 300 MCG/0.6ML IJ SOLN
300.0000 ug | Freq: Once | INTRAMUSCULAR | Status: AC
  Administered 2012-07-03: 300 ug via SUBCUTANEOUS

## 2012-07-03 NOTE — Patient Instructions (Signed)
Darbepoetin Alfa injection What is this medicine? DARBEPOETIN ALFA (dar be POE e tin AL fa) helps your body make more red blood cells. It is used to treat anemia caused by chronic kidney failure and chemotherapy. This medicine may be used for other purposes; ask your health care provider or pharmacist if you have questions. What should I tell my health care provider before I take this medicine? They need to know if you have any of these conditions: -blood clotting disorders or history of blood clots -cancer patient not on chemotherapy -cystic fibrosis -heart disease, such as angina, heart failure, or a history of a heart attack -hemoglobin level of 12 g/dL or greater -high blood pressure -low levels of folate, iron, or vitamin B12 -seizures -an unusual or allergic reaction to darbepoetin, erythropoietin, albumin, hamster proteins, latex, other medicines, foods, dyes, or preservatives -pregnant or trying to get pregnant -breast-feeding How should I use this medicine? This medicine is for injection into a vein or under the skin. It is usually given by a health care professional in a hospital or clinic setting. If you get this medicine at home, you will be taught how to prepare and give this medicine. Do not shake the solution before you withdraw a dose. Use exactly as directed. Take your medicine at regular intervals. Do not take your medicine more often than directed. It is important that you put your used needles and syringes in a special sharps container. Do not put them in a trash can. If you do not have a sharps container, call your pharmacist or healthcare provider to get one. Talk to your pediatrician regarding the use of this medicine in children. While this medicine may be used in children as young as 1 year for selected conditions, precautions do apply. Overdosage: If you think you have taken too much of this medicine contact a poison control center or emergency room at once. NOTE:  This medicine is only for you. Do not share this medicine with others. What if I miss a dose? If you miss a dose, take it as soon as you can. If it is almost time for your next dose, take only that dose. Do not take double or extra doses. What may interact with this medicine? Do not take this medicine with any of the following medications: -epoetin alfa This list may not describe all possible interactions. Give your health care provider a list of all the medicines, herbs, non-prescription drugs, or dietary supplements you use. Also tell them if you smoke, drink alcohol, or use illegal drugs. Some items may interact with your medicine. What should I watch for while using this medicine? Visit your prescriber or health care professional for regular checks on your progress and for the needed blood tests and blood pressure measurements. It is especially important for the doctor to make sure your hemoglobin level is in the desired range, to limit the risk of potential side effects and to give you the best benefit. Keep all appointments for any recommended tests. Check your blood pressure as directed. Ask your doctor what your blood pressure should be and when you should contact him or her. As your body makes more red blood cells, you may need to take iron, folic acid, or vitamin B supplements. Ask your doctor or health care provider which products are right for you. If you have kidney disease continue dietary restrictions, even though this medication can make you feel better. Talk with your doctor or health care professional about the   foods you eat and the vitamins that you take. What side effects may I notice from receiving this medicine? Side effects that you should report to your doctor or health care professional as soon as possible: -allergic reactions like skin rash, itching or hives, swelling of the face, lips, or tongue -breathing problems -changes in vision -chest pain -confusion, trouble speaking  or understanding -feeling faint or lightheaded, falls -high blood pressure -muscle aches or pains -pain, swelling, warmth in the leg -rapid weight gain -severe headaches -sudden numbness or weakness of the face, arm or leg -trouble walking, dizziness, loss of balance or coordination -seizures (convulsions) -swelling of the ankles, feet, hands -unusually weak or tired Side effects that usually do not require medical attention (report to your doctor or health care professional if they continue or are bothersome): -diarrhea -fever, chills (flu-like symptoms) -headaches -nausea, vomiting -redness, stinging, or swelling at site where injected This list may not describe all possible side effects. Call your doctor for medical advice about side effects. You may report side effects to FDA at 1-800-FDA-1088. Where should I keep my medicine? Keep out of the reach of children. Store in a refrigerator between 2 and 8 degrees C (36 and 46 degrees F). Do not freeze. Do not shake. Throw away any unused portion if using a single-dose vial. Throw away any unused medicine after the expiration date. NOTE: This sheet is a summary. It may not cover all possible information. If you have questions about this medicine, talk to your doctor, pharmacist, or health care provider.  2012, [employer]. (08/06/2008 10:23:57 AM) 

## 2012-07-10 ENCOUNTER — Encounter (HOSPITAL_COMMUNITY)
Admission: RE | Admit: 2012-07-10 | Discharge: 2012-07-10 | Disposition: A | Discharge status: Home / Self Care | Payer: PRIVATE HEALTH INSURANCE | Source: Ambulatory Visit | Attending: Hematology & Oncology | Admitting: Hematology & Oncology

## 2012-07-10 DIAGNOSIS — D649 Anemia, unspecified: Secondary | ICD-10-CM | POA: Insufficient documentation

## 2012-07-10 DIAGNOSIS — N289 Disorder of kidney and ureter, unspecified: Secondary | ICD-10-CM | POA: Insufficient documentation

## 2012-07-10 DIAGNOSIS — D588 Other specified hereditary hemolytic anemias: Secondary | ICD-10-CM | POA: Insufficient documentation

## 2012-07-17 ENCOUNTER — Telehealth: Payer: Self-pay | Admitting: Hematology & Oncology

## 2012-07-17 NOTE — Telephone Encounter (Signed)
Fannie Knee move 11-12 to 11-14 due to transportation issues.

## 2012-07-18 ENCOUNTER — Ambulatory Visit: Payer: PRIVATE HEALTH INSURANCE | Admitting: Medical

## 2012-07-18 ENCOUNTER — Other Ambulatory Visit: Payer: PRIVATE HEALTH INSURANCE | Admitting: Lab

## 2012-07-18 ENCOUNTER — Ambulatory Visit: Payer: PRIVATE HEALTH INSURANCE | Admitting: Hematology & Oncology

## 2012-07-18 ENCOUNTER — Ambulatory Visit: Payer: PRIVATE HEALTH INSURANCE

## 2012-07-20 ENCOUNTER — Ambulatory Visit (HOSPITAL_BASED_OUTPATIENT_CLINIC_OR_DEPARTMENT_OTHER): Payer: PRIVATE HEALTH INSURANCE

## 2012-07-20 ENCOUNTER — Telehealth: Payer: Self-pay | Admitting: Hematology & Oncology

## 2012-07-20 ENCOUNTER — Other Ambulatory Visit (HOSPITAL_BASED_OUTPATIENT_CLINIC_OR_DEPARTMENT_OTHER): Payer: PRIVATE HEALTH INSURANCE | Admitting: Lab

## 2012-07-20 ENCOUNTER — Ambulatory Visit (HOSPITAL_BASED_OUTPATIENT_CLINIC_OR_DEPARTMENT_OTHER): Payer: PRIVATE HEALTH INSURANCE | Admitting: Medical

## 2012-07-20 VITALS — BP 143/75 | HR 63 | Temp 98.1°F | Resp 16

## 2012-07-20 DIAGNOSIS — N039 Chronic nephritic syndrome with unspecified morphologic changes: Secondary | ICD-10-CM

## 2012-07-20 DIAGNOSIS — D599 Acquired hemolytic anemia, unspecified: Secondary | ICD-10-CM

## 2012-07-20 DIAGNOSIS — N289 Disorder of kidney and ureter, unspecified: Secondary | ICD-10-CM

## 2012-07-20 DIAGNOSIS — D649 Anemia, unspecified: Secondary | ICD-10-CM

## 2012-07-20 DIAGNOSIS — D500 Iron deficiency anemia secondary to blood loss (chronic): Secondary | ICD-10-CM

## 2012-07-20 DIAGNOSIS — N189 Chronic kidney disease, unspecified: Secondary | ICD-10-CM

## 2012-07-20 DIAGNOSIS — D596 Hemoglobinuria due to hemolysis from other external causes: Secondary | ICD-10-CM

## 2012-07-20 LAB — CBC WITH DIFFERENTIAL (CANCER CENTER ONLY)
BASO%: 0.9 % (ref 0.0–2.0)
EOS%: 6.4 % (ref 0.0–7.0)
HCT: UNDETERMINED % (ref 34.8–46.6)
LYMPH%: 25.9 % (ref 14.0–48.0)
MCHC: UNDETERMINED g/dL (ref 32.0–36.0)
MCV: UNDETERMINED fL (ref 81–101)
MONO%: 9 % (ref 0.0–13.0)
NEUT%: 57.8 % (ref 39.6–80.0)
Platelets: 203 10*3/uL (ref 145–400)

## 2012-07-20 LAB — CHCC SATELLITE - SMEAR

## 2012-07-20 MED ORDER — DARBEPOETIN ALFA-POLYSORBATE 300 MCG/0.6ML IJ SOLN
300.0000 ug | Freq: Once | INTRAMUSCULAR | Status: AC
  Administered 2012-07-20: 300 ug via SUBCUTANEOUS

## 2012-07-20 NOTE — Progress Notes (Signed)
Diagnoses: #1 cold agglutinin hemolytic anemia. #2 anemia, secondary to renal insufficiency.  Current therapy: #1 folic acid 2 mg by mouth daily. #2 Aranesp 300 mcg subcutaneous as needed.  For hemoglobin less than 10.  Interim history: [name] presents today for an office followup visit.  Her aide accompanies her. Overall, she is doing fairly well for her age.  Her hemoglobin has actually improved some.Marland Kitchen  Her hemoglobin is up to 9.6 today,, however, she will require an Aranesp injection.  She continues to remain at home with her daughter and home health aide.  She's not reported any new problems since the last time, we saw her.  She has a good appetite.  She does sleep majority of the time.  She does not appear to be hurting.  She's not complain of nausea, vomiting, diarrhea, constipation, chest pain, shortness of breath, cough, fevers, chills, or night sweats.  She denies any headaches, visual changes, or rashes.  She denies any lower leg swelling.  She denies any obvious, bleeding.  Overall, she seems to be getting along quite well.  Review of Systems: Constitutional:Negative for malaise/fatigue, fever, chills, weight loss, diaphoresis, activity change, appetite change, and unexpected weight change.  HEENT: Negative for double vision, blurred vision, visual loss, ear pain, tinnitus, congestion, rhinorrhea, epistaxis sore throat or sinus disease, oral pain/lesion, tongue soreness Respiratory: Negative for cough, chest tightness, shortness of breath, wheezing and stridor.  Cardiovascular: Negative for chest pain, palpitations, leg swelling, orthopnea, PND, DOE or claudication Gastrointestinal: Negative for nausea, vomiting, abdominal pain, diarrhea, constipation, blood in stool, melena, hematochezia, abdominal distention, anal bleeding, rectal pain, anorexia and hematemesis.  Genitourinary: Negative for dysuria, frequency, hematuria,  Musculoskeletal: Negative for myalgias, back pain, joint  swelling, arthralgias and gait problem.  Skin: Negative for rash, color change, pallor and wound.  Neurological:. Negative for dizziness/light-headedness, tremors, seizures, syncope, facial asymmetry, speech difficulty, weakness, numbness, headaches and paresthesias.  Hematological: Negative for adenopathy. Does not bruise/bleed easily.  Psychiatric/Behavioral:  Negative for depression, no loss of interest in normal activity or change in sleep pattern.   Physical Exam: This is a chronically ill-appearing 78 year old, African American, female, in no obvious distress Vitals:  Temperature 90.1 degrees, pulse 63, respirations 16, blood pressure 143/75.  Weight was not taken today HEENT reveals a normocephalic, atraumatic skull, no scleral icterus, no oral lesions  Neck is supple without any cervical or supraclavicular adenopathy.  Lungs are clear to auscultation bilaterally. There are no wheezes, rales or rhonci Cardiac is regular rate and rhythm with a normal S1 and S2. There are no murmurs, rubs, or bruits.  Abdomen is soft with good bowel sounds, there is no palpable mass. There is no palpable hepatosplenomegaly. There is no palpable fluid wave.  Musculoskeletal no tenderness of the spine, ribs, or hips.  Extremities there are no clubbing, cyanosis, or edema.  Skin no petechia, purpura or ecchymosis Neurologic is nonfocal.  Laboratory Data: White count 6.9, hemoglobin 9.6, hematocrit unable to determine, platelets 203,000  Current Outpatient Prescriptions on File Prior to Visit  Medication Sig Dispense Refill  . acetaminophen (TYLENOL) 325 MG tablet Take 650 mg by mouth every 6 (six) hours as needed.        . cyanocobalamin 1000 MCG tablet Inject 100 mcg into the muscle every 30 (thirty) days.        . folic acid (FOLVITE) 1 MG tablet Take 2 mg by mouth daily.        Marland Kitchen HYDROcodone-acetaminophen (NORCO) 10-325 MG per tablet  Take 1 tablet by mouth every 6 (six) hours as needed.      .  Multiple Vitamin (MULTIVITAMIN) tablet Take 1 tablet by mouth daily.        . sennosides-docusate sodium (SENOKOT-S) 8.6-50 MG tablet Take 1 tablet by mouth daily.        . timolol (TIMOPTIC) 0.5 % ophthalmic solution       . UNKNOWN TO PATIENT Patients assistant was not aware of what medications patient was taking, printed list given to assistant to review with home med's and call us with update. 06-05-12 again did not bring in medication list and not sure of what she is taking. ST.       Assessment/Plan:  This is a pleasant, 78 year old, African American, female, with the following issues:  #1 chronic, low-grade hemolytic anemia, secondary to cold agglutinin disease.  Her hemoglobin is 9.6 today, as, such, we will go ahead and give her an Aranesp injection.  We do give Aranesp for hemoglobin less than 10.  I will have her come back in one month for CBC check and Aranesp if need be.  #2.  Followup we will plan on seeing Ms. Stroder in about 6 weeks but before then should there be questions or concerns.

## 2012-07-20 NOTE — Telephone Encounter (Signed)
Pt aware of 12-27 appointment °

## 2012-07-20 NOTE — Patient Instructions (Addendum)
Darbepoetin Alfa injection What is this medicine? DARBEPOETIN ALFA (dar be POE e tin AL fa) helps your body make more red blood cells. It is used to treat anemia caused by chronic kidney failure and chemotherapy. This medicine may be used for other purposes; ask your health care provider or pharmacist if you have questions. What should I tell my health care provider before I take this medicine? They need to know if you have any of these conditions: -blood clotting disorders or history of blood clots -cancer patient not on chemotherapy -cystic fibrosis -heart disease, such as angina, heart failure, or a history of a heart attack -hemoglobin level of 12 g/dL or greater -high blood pressure -low levels of folate, iron, or vitamin B12 -seizures -an unusual or allergic reaction to darbepoetin, erythropoietin, albumin, hamster proteins, latex, other medicines, foods, dyes, or preservatives -pregnant or trying to get pregnant -breast-feeding How should I use this medicine? This medicine is for injection into a vein or under the skin. It is usually given by a health care professional in a hospital or clinic setting. If you get this medicine at home, you will be taught how to prepare and give this medicine. Do not shake the solution before you withdraw a dose. Use exactly as directed. Take your medicine at regular intervals. Do not take your medicine more often than directed. It is important that you put your used needles and syringes in a special sharps container. Do not put them in a trash can. If you do not have a sharps container, call your pharmacist or healthcare provider to get one. Talk to your pediatrician regarding the use of this medicine in children. While this medicine may be used in children as young as 1 year for selected conditions, precautions do apply. Overdosage: If you think you have taken too much of this medicine contact a poison control center or emergency room at once. NOTE:  This medicine is only for you. Do not share this medicine with others. What if I miss a dose? If you miss a dose, take it as soon as you can. If it is almost time for your next dose, take only that dose. Do not take double or extra doses. What may interact with this medicine? Do not take this medicine with any of the following medications: -epoetin alfa This list may not describe all possible interactions. Give your health care provider a list of all the medicines, herbs, non-prescription drugs, or dietary supplements you use. Also tell them if you smoke, drink alcohol, or use illegal drugs. Some items may interact with your medicine. What should I watch for while using this medicine? Visit your prescriber or health care professional for regular checks on your progress and for the needed blood tests and blood pressure measurements. It is especially important for the doctor to make sure your hemoglobin level is in the desired range, to limit the risk of potential side effects and to give you the best benefit. Keep all appointments for any recommended tests. Check your blood pressure as directed. Ask your doctor what your blood pressure should be and when you should contact him or her. As your body makes more red blood cells, you may need to take iron, folic acid, or vitamin B supplements. Ask your doctor or health care provider which products are right for you. If you have kidney disease continue dietary restrictions, even though this medication can make you feel better. Talk with your doctor or health care professional about the   foods you eat and the vitamins that you take. What side effects may I notice from receiving this medicine? Side effects that you should report to your doctor or health care professional as soon as possible: -allergic reactions like skin rash, itching or hives, swelling of the face, lips, or tongue -breathing problems -changes in vision -chest pain -confusion, trouble speaking  or understanding -feeling faint or lightheaded, falls -high blood pressure -muscle aches or pains -pain, swelling, warmth in the leg -rapid weight gain -severe headaches -sudden numbness or weakness of the face, arm or leg -trouble walking, dizziness, loss of balance or coordination -seizures (convulsions) -swelling of the ankles, feet, hands -unusually weak or tired Side effects that usually do not require medical attention (report to your doctor or health care professional if they continue or are bothersome): -diarrhea -fever, chills (flu-like symptoms) -headaches -nausea, vomiting -redness, stinging, or swelling at site where injected This list may not describe all possible side effects. Call your doctor for medical advice about side effects. You may report side effects to FDA at 1-800-FDA-1088. Where should I keep my medicine? Keep out of the reach of children. Store in a refrigerator between 2 and 8 degrees C (36 and 46 degrees F). Do not freeze. Do not shake. Throw away any unused portion if using a single-dose vial. Throw away any unused medicine after the expiration date. NOTE: This sheet is a summary. It may not cover all possible information. If you have questions about this medicine, talk to your doctor, pharmacist, or health care provider.  2012, [employer]. (08/06/2008 10:23:57 AM) 

## 2012-08-17 ENCOUNTER — Encounter (HOSPITAL_COMMUNITY)
Admission: RE | Admit: 2012-08-17 | Discharge: 2012-08-17 | Disposition: A | Discharge status: Home / Self Care | Payer: PRIVATE HEALTH INSURANCE | Source: Ambulatory Visit | Attending: Hematology & Oncology | Admitting: Hematology & Oncology

## 2012-08-17 DIAGNOSIS — N289 Disorder of kidney and ureter, unspecified: Secondary | ICD-10-CM | POA: Insufficient documentation

## 2012-08-17 DIAGNOSIS — D588 Other specified hereditary hemolytic anemias: Secondary | ICD-10-CM | POA: Insufficient documentation

## 2012-08-17 DIAGNOSIS — D649 Anemia, unspecified: Secondary | ICD-10-CM | POA: Insufficient documentation

## 2012-09-01 ENCOUNTER — Other Ambulatory Visit (HOSPITAL_BASED_OUTPATIENT_CLINIC_OR_DEPARTMENT_OTHER): Payer: PRIVATE HEALTH INSURANCE | Admitting: Lab

## 2012-09-01 ENCOUNTER — Ambulatory Visit (HOSPITAL_BASED_OUTPATIENT_CLINIC_OR_DEPARTMENT_OTHER): Payer: PRIVATE HEALTH INSURANCE

## 2012-09-01 ENCOUNTER — Ambulatory Visit (HOSPITAL_BASED_OUTPATIENT_CLINIC_OR_DEPARTMENT_OTHER): Payer: PRIVATE HEALTH INSURANCE | Admitting: Hematology & Oncology

## 2012-09-01 VITALS — BP 117/62 | HR 76 | Temp 97.3°F | Resp 14 | Ht 64.0 in

## 2012-09-01 DIAGNOSIS — N289 Disorder of kidney and ureter, unspecified: Secondary | ICD-10-CM

## 2012-09-01 DIAGNOSIS — D649 Anemia, unspecified: Secondary | ICD-10-CM

## 2012-09-01 DIAGNOSIS — D500 Iron deficiency anemia secondary to blood loss (chronic): Secondary | ICD-10-CM

## 2012-09-01 LAB — CBC WITH DIFFERENTIAL (CANCER CENTER ONLY)
BASO#: 0 10*3/uL (ref 0.0–0.2)
EOS%: 5.3 % (ref 0.0–7.0)
LYMPH%: 21.2 % (ref 14.0–48.0)
MCH: UNDETERMINED pg (ref 26.0–34.0)
MCHC: UNDETERMINED g/dL (ref 32.0–36.0)
MONO%: 8.2 % (ref 0.0–13.0)
NEUT#: 4.5 10*3/uL (ref 1.5–6.5)
Platelets: 257 10*3/uL (ref 145–400)

## 2012-09-01 LAB — CHCC SATELLITE - SMEAR

## 2012-09-01 MED ORDER — DARBEPOETIN ALFA-POLYSORBATE 300 MCG/0.6ML IJ SOLN
300.0000 ug | Freq: Once | INTRAMUSCULAR | Status: AC
  Administered 2012-09-01: 300 ug via SUBCUTANEOUS

## 2012-09-01 NOTE — Progress Notes (Signed)
CC:   [name], M.D.  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  CURRENT THERAPY: 1. Folic acid 2 mg p.o. daily. 2. Aranesp 300 mcg subcu as needed for hemoglobin less than 10.  INTERIM HISTORY:  [name] comes in for followup.  She is 78 years old.  She has actually done pretty well this past year.  I do not think she was hospitalized at all.  With the cold weather, it is no surprise that her hemoglobin is down. This happens to her quite often.  We have not had to transfuse her for quite awhile which is a blessing.  Her last cold agglutinin titer was over 20,000 back in November.  She is in a wheelchair.  She is covered up.  She seems comfortable.  According to her family, she seemed to eat okay at Christmas.  She does not eat that much.  She is a little constipated.  I told them that she can have Senokot twice a day if needed.  PHYSICAL EXAMINATION:  General:  This is an elderly, frail African American female in no obvious distress.  Vital signs:  Show a temperature of 97.3, pulse 76, respiratory rate 14, blood pressure 117/62.  Weight was not taken.  Head and neck:  Shows some temporal muscle wasting.  She has some slightly dry oral mucosa.  No adenopathy is noted in the neck.  Lungs:  Clear.  Cardiac:  Regular rate and rhythm with no murmurs, rubs or bruits.  Abdomen:  Soft.  There is no palpable hepatosplenomegaly.  Extremities:  Shows some muscle atrophy in upper and lower extremities that is symmetric.  Skin:  Exam is slightly dry.  LABORATORY STUDIES:  White cell count is 6.9, hemoglobin 8.8, platelet count 257.  IMPRESSION:  [name] is a very nice 78 year old African American female with cold agglutinin disease.  Again, during the winter time her blood count does tend to go down.  We have to follow her monthly at this point.  Hopefully come the spring time her blood count will start going back up.  She  does have a high titer of cold agglutinins.  At her age and with her poor performance status (ECOG 3-4) there is nothing that we need to do about this.  She may need to be transfused in the future which she does okay with.    ______________________________ Josph Macho, M.D. PRE/MEDQ  D:  09/01/2012  T:  09/01/2012  Job:  [id number]

## 2012-09-01 NOTE — Progress Notes (Signed)
This office note has been dictated.

## 2012-09-01 NOTE — Patient Instructions (Signed)
Darbepoetin Alfa injection What is this medicine? DARBEPOETIN ALFA (dar be POE e tin AL fa) helps your body make more red blood cells. It is used to treat anemia caused by chronic kidney failure and chemotherapy. This medicine may be used for other purposes; ask your health care provider or pharmacist if you have questions. What should I tell my health care provider before I take this medicine? They need to know if you have any of these conditions: -blood clotting disorders or history of blood clots -cancer patient not on chemotherapy -cystic fibrosis -heart disease, such as angina, heart failure, or a history of a heart attack -hemoglobin level of 12 g/dL or greater -high blood pressure -low levels of folate, iron, or vitamin B12 -seizures -an unusual or allergic reaction to darbepoetin, erythropoietin, albumin, hamster proteins, latex, other medicines, foods, dyes, or preservatives -pregnant or trying to get pregnant -breast-feeding How should I use this medicine? This medicine is for injection into a vein or under the skin. It is usually given by a health care professional in a hospital or clinic setting. If you get this medicine at home, you will be taught how to prepare and give this medicine. Do not shake the solution before you withdraw a dose. Use exactly as directed. Take your medicine at regular intervals. Do not take your medicine more often than directed. It is important that you put your used needles and syringes in a special sharps container. Do not put them in a trash can. If you do not have a sharps container, call your pharmacist or healthcare provider to get one. Talk to your pediatrician regarding the use of this medicine in children. While this medicine may be used in children as young as 1 year for selected conditions, precautions do apply. Overdosage: If you think you have taken too much of this medicine contact a poison control center or emergency room at once. NOTE:  This medicine is only for you. Do not share this medicine with others. What if I miss a dose? If you miss a dose, take it as soon as you can. If it is almost time for your next dose, take only that dose. Do not take double or extra doses. What may interact with this medicine? Do not take this medicine with any of the following medications: -epoetin alfa This list may not describe all possible interactions. Give your health care provider a list of all the medicines, herbs, non-prescription drugs, or dietary supplements you use. Also tell them if you smoke, drink alcohol, or use illegal drugs. Some items may interact with your medicine. What should I watch for while using this medicine? Visit your prescriber or health care professional for regular checks on your progress and for the needed blood tests and blood pressure measurements. It is especially important for the doctor to make sure your hemoglobin level is in the desired range, to limit the risk of potential side effects and to give you the best benefit. Keep all appointments for any recommended tests. Check your blood pressure as directed. Ask your doctor what your blood pressure should be and when you should contact him or her. As your body makes more red blood cells, you may need to take iron, folic acid, or vitamin B supplements. Ask your doctor or health care provider which products are right for you. If you have kidney disease continue dietary restrictions, even though this medication can make you feel better. Talk with your doctor or health care professional about the   foods you eat and the vitamins that you take. What side effects may I notice from receiving this medicine? Side effects that you should report to your doctor or health care professional as soon as possible: -allergic reactions like skin rash, itching or hives, swelling of the face, lips, or tongue -breathing problems -changes in vision -chest pain -confusion, trouble speaking  or understanding -feeling faint or lightheaded, falls -high blood pressure -muscle aches or pains -pain, swelling, warmth in the leg -rapid weight gain -severe headaches -sudden numbness or weakness of the face, arm or leg -trouble walking, dizziness, loss of balance or coordination -seizures (convulsions) -swelling of the ankles, feet, hands -unusually weak or tired Side effects that usually do not require medical attention (report to your doctor or health care professional if they continue or are bothersome): -diarrhea -fever, chills (flu-like symptoms) -headaches -nausea, vomiting -redness, stinging, or swelling at site where injected This list may not describe all possible side effects. Call your doctor for medical advice about side effects. You may report side effects to FDA at 1-800-FDA-1088. Where should I keep my medicine? Keep out of the reach of children. Store in a refrigerator between 2 and 8 degrees C (36 and 46 degrees F). Do not freeze. Do not shake. Throw away any unused portion if using a single-dose vial. Throw away any unused medicine after the expiration date. NOTE: This sheet is a summary. It may not cover all possible information. If you have questions about this medicine, talk to your doctor, pharmacist, or health care provider.  2012, [employer]. (08/06/2008 10:23:57 AM) 

## 2012-09-08 LAB — COLD AGGLUTININ TITER: Cold Agglutinin Titer: 1:10240 {titer} — AB

## 2012-09-12 ENCOUNTER — Encounter (HOSPITAL_COMMUNITY)
Admission: RE | Admit: 2012-09-12 | Discharge: 2012-09-12 | Disposition: A | Discharge status: Home / Self Care | Payer: PRIVATE HEALTH INSURANCE | Source: Ambulatory Visit | Attending: Hematology & Oncology | Admitting: Hematology & Oncology

## 2012-09-12 DIAGNOSIS — D649 Anemia, unspecified: Secondary | ICD-10-CM | POA: Insufficient documentation

## 2012-09-12 DIAGNOSIS — D588 Other specified hereditary hemolytic anemias: Secondary | ICD-10-CM | POA: Insufficient documentation

## 2012-09-12 DIAGNOSIS — N289 Disorder of kidney and ureter, unspecified: Secondary | ICD-10-CM | POA: Insufficient documentation

## 2012-09-29 ENCOUNTER — Ambulatory Visit (HOSPITAL_BASED_OUTPATIENT_CLINIC_OR_DEPARTMENT_OTHER): Payer: PRIVATE HEALTH INSURANCE

## 2012-09-29 ENCOUNTER — Ambulatory Visit (HOSPITAL_BASED_OUTPATIENT_CLINIC_OR_DEPARTMENT_OTHER): Payer: PRIVATE HEALTH INSURANCE | Admitting: Medical

## 2012-09-29 ENCOUNTER — Other Ambulatory Visit (HOSPITAL_BASED_OUTPATIENT_CLINIC_OR_DEPARTMENT_OTHER): Payer: PRIVATE HEALTH INSURANCE | Admitting: Lab

## 2012-09-29 VITALS — BP 149/80 | HR 79 | Temp 97.6°F | Resp 16 | Ht 64.0 in

## 2012-09-29 DIAGNOSIS — N289 Disorder of kidney and ureter, unspecified: Secondary | ICD-10-CM

## 2012-09-29 DIAGNOSIS — N189 Chronic kidney disease, unspecified: Secondary | ICD-10-CM

## 2012-09-29 DIAGNOSIS — D500 Iron deficiency anemia secondary to blood loss (chronic): Secondary | ICD-10-CM

## 2012-09-29 DIAGNOSIS — D649 Anemia, unspecified: Secondary | ICD-10-CM

## 2012-09-29 DIAGNOSIS — D589 Hereditary hemolytic anemia, unspecified: Secondary | ICD-10-CM

## 2012-09-29 DIAGNOSIS — N039 Chronic nephritic syndrome with unspecified morphologic changes: Secondary | ICD-10-CM

## 2012-09-29 DIAGNOSIS — D631 Anemia in chronic kidney disease: Secondary | ICD-10-CM

## 2012-09-29 LAB — MANUAL DIFFERENTIAL (CHCC SATELLITE)
BASO: 1 % (ref 0–2)
Eos: 4 % (ref 0–7)
PLT EST ~~LOC~~: ADEQUATE
SEG: 62 % (ref 40–75)

## 2012-09-29 LAB — CBC WITH DIFFERENTIAL (CANCER CENTER ONLY)
HCT: UNDETERMINED % (ref 34.8–46.6)
MCH: UNDETERMINED pg (ref 26.0–34.0)
MCHC: UNDETERMINED g/dL (ref 32.0–36.0)

## 2012-09-29 MED ORDER — DARBEPOETIN ALFA-POLYSORBATE 300 MCG/0.6ML IJ SOLN
300.0000 ug | Freq: Once | INTRAMUSCULAR | Status: AC
  Administered 2012-09-29: 300 ug via SUBCUTANEOUS

## 2012-09-29 NOTE — Progress Notes (Signed)
Diagnoses: #1.  Cold agglutinin hemolytic anemia.  #2.  Renal insufficiency with anemia.  Current therapy: #1.  Folic acid 2 mg by mouth daily.   #2.  Aranesp 300 mcg subcutaneous as needed for hemoglobin less than 10.  Interim history: Ms. [name] presents today for an office followup visit.  Her aide accompanies her.  Overall, she continues to be stable.  She is 79.  I'm actually surprise.  She came out, during the cold.  This morning.  Usually during cold weather.  Her hemoglobin is down.  I do not, believe.  We've had to transfuse her for quite some time.  For the most part, she is sedentary.  Her EGOG performance status is a 3-4.  She does have renal insufficiency and we give her Aranesp for hemoglobin less than 10. She states, that she has a decent, appetite.  She's not having any nausea, vomiting, diarrhea, constipation.  She denies any chest pain, shortness of breath, or cough.  She denies any fevers, chills, or night sweats.  She denies any headaches, visual changes, or rashes.  She denies any obvious, or abnormal bleeding.  She does require help with her activities of daily living.  She denies any lower leg swelling.  Review of Systems: Constitutional:Negative for malaise/fatigue, fever, chills, weight loss, diaphoresis, activity change, appetite change, and unexpected weight change.  HEENT: Negative for double vision, blurred vision, visual loss, ear pain, tinnitus, congestion, rhinorrhea, epistaxis sore throat or sinus disease, oral pain/lesion, tongue soreness Respiratory: Negative for cough, chest tightness, shortness of breath, wheezing and stridor.  Cardiovascular: Negative for chest pain, palpitations, leg swelling, orthopnea, PND, DOE or claudication Gastrointestinal: Negative for nausea, vomiting, abdominal pain, diarrhea, constipation, blood in stool, melena, hematochezia, abdominal distention, anal bleeding, rectal pain, anorexia and hematemesis.  Genitourinary: Negative for  dysuria, frequency, hematuria,  Musculoskeletal: Negative for myalgias, back pain, joint swelling, arthralgias and gait problem.  Skin: Negative for rash, color change, pallor and wound.  Neurological:. Negative for dizziness/light-headedness, tremors, seizures, syncope, facial asymmetry, speech difficulty, weakness, numbness, headaches and paresthesias.  Hematological: Negative for adenopathy. Does not bruise/bleed easily.  Psychiatric/Behavioral:  Negative for depression, no loss of interest in normal activity or change in sleep pattern.   Physical Exam:  This is an elderly, frail, 79 year old, African American female, in no obvious distress Vitals: Temperature 97.4 degrees, pulse 78, respirations 16, blood pressure 149 bradycardia, weight was not taken today.  Patient is in wheelchair. HEENT reveals a normocephalic, atraumatic skull, no scleral icterus, no oral lesions  Neck is supple without any cervical or supraclavicular adenopathy.  Lungs are clear to auscultation bilaterally. There are no wheezes, rales or rhonci Cardiac is regular rate and rhythm with a normal S1 and S2. There are no murmurs, rubs, or bruits.  Abdomen is soft with good bowel sounds, there is no palpable mass. There is no palpable hepatosplenomegaly. There is no palpable fluid wave.  Musculoskeletal no tenderness of the spine, ribs, or hips.  Extremities there are no clubbing, cyanosis, or edema.  Skin no petechia, purpura or ecchymosis Neurologic is nonfocal.  Laboratory Data: White count 9.2, hemoglobin 8.4, platelets clumped, platelets, however, appear adequate   Current Outpatient Prescriptions on File Prior to Visit  Medication Sig Dispense Refill  . acetaminophen (TYLENOL) 325 MG tablet Take 650 mg by mouth every 6 (six) hours as needed.        . cyanocobalamin 1000 MCG tablet Inject 100 mcg into the muscle every 30 (thirty) days.        Marland Kitchen  folic acid (FOLVITE) 1 MG tablet Take 2 mg by mouth daily.        Marland Kitchen  HYDROcodone-acetaminophen (NORCO) 10-325 MG per tablet Take 1 tablet by mouth every 6 (six) hours as needed.      . Multiple Vitamin (MULTIVITAMIN) tablet Take 1 tablet by mouth daily.        Marland Kitchen oxyCODONE-acetaminophen (PERCOCET/ROXICET) 5-325 MG per tablet 1 tablet every 8 (eight) hours as needed.       . sennosides-docusate sodium (SENOKOT-S) 8.6-50 MG tablet Take 1 tablet by mouth daily.        . timolol (TIMOPTIC) 0.5 % ophthalmic solution Place 1 drop into both eyes daily.        Assessment/Plan: This is a frail, 79 year old, African, female, with the following issues:  #1.  Chronic, low-grade hemolytic anemia.  This is secondary to cold agglutinin disease.  Again, during the wintertime.  Her blood count does tend to go down.  She does have a high titer of cold agglutinins.  At her age and with her poor performance status.  There is nothing that we need to do about  this.  We will transfuse her as needed.  #2.  Renal insufficiency with anemia.  We did give her Aranesp for hemoglobin below 10.  We will go ahead and give her an Aranesp injection today.  #3.  Followup.  We will follow back up with her in about one month, but before then should there be questions or concerns.

## 2012-09-29 NOTE — Patient Instructions (Signed)
Darbepoetin Alfa injection What is this medicine? DARBEPOETIN ALFA (dar be POE e tin AL fa) helps your body make more red blood cells. It is used to treat anemia caused by chronic kidney failure and chemotherapy. This medicine may be used for other purposes; ask your health care provider or pharmacist if you have questions. What should I tell my health care provider before I take this medicine? They need to know if you have any of these conditions: -blood clotting disorders or history of blood clots -cancer patient not on chemotherapy -cystic fibrosis -heart disease, such as angina, heart failure, or a history of a heart attack -hemoglobin level of 12 g/dL or greater -high blood pressure -low levels of folate, iron, or vitamin B12 -seizures -an unusual or allergic reaction to darbepoetin, erythropoietin, albumin, hamster proteins, latex, other medicines, foods, dyes, or preservatives -pregnant or trying to get pregnant -breast-feeding How should I use this medicine? This medicine is for injection into a vein or under the skin. It is usually given by a health care professional in a hospital or clinic setting. If you get this medicine at home, you will be taught how to prepare and give this medicine. Do not shake the solution before you withdraw a dose. Use exactly as directed. Take your medicine at regular intervals. Do not take your medicine more often than directed. It is important that you put your used needles and syringes in a special sharps container. Do not put them in a trash can. If you do not have a sharps container, call your pharmacist or healthcare provider to get one. Talk to your pediatrician regarding the use of this medicine in children. While this medicine may be used in children as young as 1 year for selected conditions, precautions do apply. Overdosage: If you think you have taken too much of this medicine contact a poison control center or emergency room at once. NOTE:  This medicine is only for you. Do not share this medicine with others. What if I miss a dose? If you miss a dose, take it as soon as you can. If it is almost time for your next dose, take only that dose. Do not take double or extra doses. What may interact with this medicine? Do not take this medicine with any of the following medications: -epoetin alfa This list may not describe all possible interactions. Give your health care provider a list of all the medicines, herbs, non-prescription drugs, or dietary supplements you use. Also tell them if you smoke, drink alcohol, or use illegal drugs. Some items may interact with your medicine. What should I watch for while using this medicine? Visit your prescriber or health care professional for regular checks on your progress and for the needed blood tests and blood pressure measurements. It is especially important for the doctor to make sure your hemoglobin level is in the desired range, to limit the risk of potential side effects and to give you the best benefit. Keep all appointments for any recommended tests. Check your blood pressure as directed. Ask your doctor what your blood pressure should be and when you should contact him or her. As your body makes more red blood cells, you may need to take iron, folic acid, or vitamin B supplements. Ask your doctor or health care provider which products are right for you. If you have kidney disease continue dietary restrictions, even though this medication can make you feel better. Talk with your doctor or health care professional about the   foods you eat and the vitamins that you take. What side effects may I notice from receiving this medicine? Side effects that you should report to your doctor or health care professional as soon as possible: -allergic reactions like skin rash, itching or hives, swelling of the face, lips, or tongue -breathing problems -changes in vision -chest pain -confusion, trouble speaking  or understanding -feeling faint or lightheaded, falls -high blood pressure -muscle aches or pains -pain, swelling, warmth in the leg -rapid weight gain -severe headaches -sudden numbness or weakness of the face, arm or leg -trouble walking, dizziness, loss of balance or coordination -seizures (convulsions) -swelling of the ankles, feet, hands -unusually weak or tired Side effects that usually do not require medical attention (report to your doctor or health care professional if they continue or are bothersome): -diarrhea -fever, chills (flu-like symptoms) -headaches -nausea, vomiting -redness, stinging, or swelling at site where injected This list may not describe all possible side effects. Call your doctor for medical advice about side effects. You may report side effects to FDA at 1-800-FDA-1088. Where should I keep my medicine? Keep out of the reach of children. Store in a refrigerator between 2 and 8 degrees C (36 and 46 degrees F). Do not freeze. Do not shake. Throw away any unused portion if using a single-dose vial. Throw away any unused medicine after the expiration date. NOTE: This sheet is a summary. It may not cover all possible information. If you have questions about this medicine, talk to your doctor, pharmacist, or health care provider.  2012, [employer]. (08/06/2008 10:23:57 AM) 

## 2012-10-10 ENCOUNTER — Encounter (HOSPITAL_COMMUNITY)
Admission: RE | Admit: 2012-10-10 | Discharge: 2012-10-10 | Disposition: A | Discharge status: Home / Self Care | Payer: PRIVATE HEALTH INSURANCE | Source: Ambulatory Visit | Attending: Hematology & Oncology | Admitting: Hematology & Oncology

## 2012-10-10 DIAGNOSIS — N289 Disorder of kidney and ureter, unspecified: Secondary | ICD-10-CM | POA: Insufficient documentation

## 2012-10-10 DIAGNOSIS — D649 Anemia, unspecified: Secondary | ICD-10-CM | POA: Insufficient documentation

## 2012-10-10 DIAGNOSIS — D588 Other specified hereditary hemolytic anemias: Secondary | ICD-10-CM | POA: Insufficient documentation

## 2012-11-08 ENCOUNTER — Other Ambulatory Visit: Payer: Self-pay | Admitting: Medical

## 2012-11-08 DIAGNOSIS — D500 Iron deficiency anemia secondary to blood loss (chronic): Secondary | ICD-10-CM

## 2012-11-09 ENCOUNTER — Telehealth: Payer: Self-pay | Admitting: Hematology & Oncology

## 2012-11-09 ENCOUNTER — Ambulatory Visit (HOSPITAL_BASED_OUTPATIENT_CLINIC_OR_DEPARTMENT_OTHER): Payer: PRIVATE HEALTH INSURANCE

## 2012-11-09 ENCOUNTER — Other Ambulatory Visit: Payer: Self-pay | Admitting: Medical

## 2012-11-09 ENCOUNTER — Other Ambulatory Visit (HOSPITAL_BASED_OUTPATIENT_CLINIC_OR_DEPARTMENT_OTHER): Payer: PRIVATE HEALTH INSURANCE | Admitting: Lab

## 2012-11-09 ENCOUNTER — Ambulatory Visit (HOSPITAL_BASED_OUTPATIENT_CLINIC_OR_DEPARTMENT_OTHER): Payer: PRIVATE HEALTH INSURANCE | Admitting: Medical

## 2012-11-09 VITALS — BP 132/67 | HR 84 | Temp 97.7°F | Resp 14 | Ht 64.0 in

## 2012-11-09 DIAGNOSIS — D500 Iron deficiency anemia secondary to blood loss (chronic): Secondary | ICD-10-CM

## 2012-11-09 DIAGNOSIS — D631 Anemia in chronic kidney disease: Secondary | ICD-10-CM

## 2012-11-09 DIAGNOSIS — N289 Disorder of kidney and ureter, unspecified: Secondary | ICD-10-CM

## 2012-11-09 DIAGNOSIS — N189 Chronic kidney disease, unspecified: Secondary | ICD-10-CM

## 2012-11-09 DIAGNOSIS — D649 Anemia, unspecified: Secondary | ICD-10-CM

## 2012-11-09 LAB — CBC WITH DIFFERENTIAL (CANCER CENTER ONLY)
BASO#: 0 10*3/uL (ref 0.0–0.2)
Eosinophils Absolute: 0.3 10*3/uL (ref 0.0–0.5)
HGB: 7.6 g/dL — ABNORMAL LOW (ref 11.6–15.9)
LYMPH#: 1.8 10*3/uL (ref 0.9–3.3)
MCH: UNDETERMINED pg (ref 26.0–34.0)
MONO%: 7.8 % (ref 0.0–13.0)
NEUT#: 4.4 10*3/uL (ref 1.5–6.5)
RBC: UNDETERMINED 10*6/uL (ref 3.70–5.32)

## 2012-11-09 LAB — HOLD TUBE, BLOOD BANK - CHCC SATELLITE

## 2012-11-09 MED ORDER — DARBEPOETIN ALFA-POLYSORBATE 300 MCG/0.6ML IJ SOLN
300.0000 ug | Freq: Once | INTRAMUSCULAR | Status: AC
  Administered 2012-11-09: 300 ug via SUBCUTANEOUS

## 2012-11-09 NOTE — Telephone Encounter (Signed)
Per orders to [facility] on 3/14 in [place] and transfusion on 3/15 in [place].  i called and spoke with Marcelino Duster and she sch apts for 3/14 and 3/15.  i called Fannie Knee (patient's daughter) and gave her apt location, dates, and times.

## 2012-11-09 NOTE — Progress Notes (Signed)
Diagnoses: #1.  Cold agglutinin hemolytic anemia.  #2.  Renal insufficiency with anemia.  Current therapy: #1.  Folic acid 2 mg by mouth daily.   #2.  Aranesp 300 mcg subcutaneous as needed for hemoglobin less than 10.  Interim history: [name] presents today for an office followup visit.  Her aide accompanies her.  She is 79.  Her hemoglobin is low.  Today.  Her hemoglobin is 7.6.  We are going to go ahead and transfuse her with 2 units of packed red blood cells sometime next week.  She does have to know in advance, secondary to get transportation set up.   I'm really not surprised with as cold as the weather has been, that her hemoglobin is on the lower side.  For the most part, she is sedentary.  Her EGOG performance status is a 3-4.  She does have renal insufficiency and we give her Aranesp for hemoglobin less than 10. She states, that she has a decent, appetite.  She's not having any nausea, vomiting, diarrhea, constipation.  She denies any chest pain, shortness of breath, or cough.  She denies any fevers, chills, or night sweats.  She denies any headaches, visual changes, or rashes.  She denies any obvious, or abnormal bleeding.  She does require help with her activities of daily living.  She denies any lower leg swelling.  Review of Systems: Constitutional:Negative for malaise/fatigue, fever, chills, weight loss, diaphoresis, activity change, appetite change, and unexpected weight change.  HEENT: Negative for double vision, blurred vision, visual loss, ear pain, tinnitus, congestion, rhinorrhea, epistaxis sore throat or sinus disease, oral pain/lesion, tongue soreness Respiratory: Negative for cough, chest tightness, shortness of breath, wheezing and stridor.  Cardiovascular: Negative for chest pain, palpitations, leg swelling, orthopnea, PND, DOE or claudication Gastrointestinal: Negative for nausea, vomiting, abdominal pain, diarrhea, constipation, blood in stool, melena, hematochezia,  abdominal distention, anal bleeding, rectal pain, anorexia and hematemesis.  Genitourinary: Negative for dysuria, frequency, hematuria,  Musculoskeletal: Negative for myalgias, back pain, joint swelling, arthralgias and gait problem.  Skin: Negative for rash, color change, pallor and wound.  Neurological:. Negative for dizziness/light-headedness, tremors, seizures, syncope, facial asymmetry, speech difficulty, weakness, numbness, headaches and paresthesias.  Hematological: Negative for adenopathy. Does not bruise/bleed easily.  Psychiatric/Behavioral:  Negative for depression, no loss of interest in normal activity or change in sleep pattern.   Physical Exam:  This is an elderly, frail, 79 year old, African American female, in no obvious distress Vitals: Temperature 97.6 degrees, pulse 76, respiration 14, blood pressure 132/67, weight not taken as patient is in a wheelchair. HEENT reveals a normocephalic, atraumatic skull, no scleral icterus, no oral lesions  Neck is supple without any cervical or supraclavicular adenopathy.  Lungs are clear to auscultation bilaterally. There are no wheezes, rales or rhonci Cardiac is regular rate and rhythm with a normal S1 and S2. There are no murmurs, rubs, or bruits.  Abdomen is soft with good bowel sounds, there is no palpable mass. There is no palpable hepatosplenomegaly. There is no palpable fluid wave.  Musculoskeletal no tenderness of the spine, ribs, or hips.  Extremities there are no clubbing, cyanosis, or edema.  Skin no petechia, purpura or ecchymosis Neurologic is nonfocal.  Laboratory Data: White count 7.1, hemoglobin 10.6, platelets 270,000   Current Outpatient Prescriptions on File Prior to Visit  Medication Sig Dispense Refill  . acetaminophen (TYLENOL) 325 MG tablet Take 650 mg by mouth every 6 (six) hours as needed.        Marland Kitchen  cyanocobalamin 1000 MCG tablet Inject 100 mcg into the muscle every 30 (thirty) days.        . folic acid  (FOLVITE) 1 MG tablet Take 2 mg by mouth daily.        Marland Kitchen HYDROcodone-acetaminophen (NORCO) 10-325 MG per tablet Take 1 tablet by mouth every 6 (six) hours as needed.      . Multiple Vitamin (MULTIVITAMIN) tablet Take 1 tablet by mouth daily.        Marland Kitchen oxyCODONE-acetaminophen (PERCOCET/ROXICET) 5-325 MG per tablet 1 tablet every 8 (eight) hours as needed.       . sennosides-docusate sodium (SENOKOT-S) 8.6-50 MG tablet Take 1 tablet by mouth daily.        . timolol (TIMOPTIC) 0.5 % ophthalmic solution Place 1 drop into both eyes daily.        Assessment/Plan: This is a frail, 79 year old, African, female, with the following issues:  #1.  Chronic, low-grade hemolytic anemia.  This is secondary to cold agglutinin disease.  Again, during the wintertime her blood count does tend to go down.  She does have a high titer of cold agglutinins.  At her age and with her poor performance status there is nothing that we need to do about  this.  We will go ahead and set her up for 2 units of packed red blood cells next Tuesday.  #2.  Renal insufficiency with anemia.  We did give her Aranesp for hemoglobin below 10.  We will go ahead and give her an Aranesp injection today.  #3.  Followup.  We will follow back up with her in about one month, but before then should there be questions or concerns.

## 2012-11-16 LAB — COLD AGGLUTININ TITER: Cold Agglutinin Titer: 1:20480 {titer} — AB

## 2012-11-17 ENCOUNTER — Encounter (HOSPITAL_COMMUNITY)
Admission: RE | Admit: 2012-11-17 | Discharge: 2012-11-17 | Disposition: A | Discharge status: Home / Self Care | Payer: PRIVATE HEALTH INSURANCE | Source: Ambulatory Visit | Attending: Hematology & Oncology | Admitting: Hematology & Oncology

## 2012-11-17 ENCOUNTER — Other Ambulatory Visit: Payer: PRIVATE HEALTH INSURANCE | Admitting: Lab

## 2012-11-17 DIAGNOSIS — D591 Autoimmune hemolytic anemia, unspecified: Secondary | ICD-10-CM | POA: Insufficient documentation

## 2012-11-18 ENCOUNTER — Ambulatory Visit (HOSPITAL_BASED_OUTPATIENT_CLINIC_OR_DEPARTMENT_OTHER): Payer: PRIVATE HEALTH INSURANCE

## 2012-11-18 VITALS — BP 148/76 | HR 73 | Temp 97.7°F | Resp 16

## 2012-11-18 DIAGNOSIS — D62 Acute posthemorrhagic anemia: Secondary | ICD-10-CM

## 2012-11-18 DIAGNOSIS — D649 Anemia, unspecified: Secondary | ICD-10-CM

## 2012-11-18 MED ORDER — ACETAMINOPHEN 325 MG PO TABS
650.0000 mg | ORAL_TABLET | Freq: Once | ORAL | Status: AC
  Administered 2012-11-18: 650 mg via ORAL

## 2012-11-18 MED ORDER — DIPHENHYDRAMINE HCL 25 MG PO CAPS
25.0000 mg | ORAL_CAPSULE | Freq: Once | ORAL | Status: AC
  Administered 2012-11-18: 25 mg via ORAL

## 2012-11-18 MED ORDER — SODIUM CHLORIDE 0.9 % IV SOLN
Freq: Once | INTRAVENOUS | Status: AC
  Administered 2012-11-18: 09:00:00 via INTRAVENOUS

## 2012-11-18 NOTE — Patient Instructions (Signed)
Blood Transfusion Information WHAT IS A BLOOD TRANSFUSION? A transfusion is the replacement of blood or some of its parts. Blood is made up of multiple cells which provide different functions.  Red blood cells carry oxygen and are used for blood loss replacement.  White blood cells fight against infection.  Platelets control bleeding.  Plasma helps clot blood.  Other blood products are available for specialized needs, such as hemophilia or other clotting disorders. BEFORE THE TRANSFUSION  Who gives blood for transfusions?   You may be able to donate blood to be used at a later date on yourself (autologous donation).  Relatives can be asked to donate blood. This is generally not any safer than if you have received blood from a stranger. The same precautions are taken to ensure safety when a relative's blood is donated.  Healthy volunteers who are fully evaluated to make sure their blood is safe. This is blood bank blood. Transfusion therapy is the safest it has ever been in the practice of medicine. Before blood is taken from a donor, a complete history is taken to make sure that person has no history of diseases nor engages in risky social behavior (examples are intravenous drug use or sexual activity with multiple partners). The donor's travel history is screened to minimize risk of transmitting infections, such as malaria. The donated blood is tested for signs of infectious diseases, such as HIV and hepatitis. The blood is then tested to be sure it is compatible with you in order to minimize the chance of a transfusion reaction. If you or a relative donates blood, this is often done in anticipation of surgery and is not appropriate for emergency situations. It takes many days to process the donated blood. RISKS AND COMPLICATIONS Although transfusion therapy is very safe and saves many lives, the main dangers of transfusion include:   Getting an infectious disease.  Developing a  transfusion reaction. This is an allergic reaction to something in the blood you were given. Every precaution is taken to prevent this. The decision to have a blood transfusion has been considered carefully by your caregiver before blood is given. Blood is not given unless the benefits outweigh the risks. AFTER THE TRANSFUSION  Right after receiving a blood transfusion, you will usually feel much better and more energetic. This is especially true if your red blood cells have gotten low (anemic). The transfusion raises the level of the red blood cells which carry oxygen, and this usually causes an energy increase.  The nurse administering the transfusion will monitor you carefully for complications. HOME CARE INSTRUCTIONS  No special instructions are needed after a transfusion. You may find your energy is better. Speak with your caregiver about any limitations on activity for underlying diseases you may have. SEEK MEDICAL CARE IF:   Your condition is not improving after your transfusion.  You develop redness or irritation at the intravenous (IV) site. SEEK IMMEDIATE MEDICAL CARE IF:  Any of the following symptoms occur over the next 12 hours:  Shaking chills.  You have a temperature by mouth above 102 F (38.9 C), not controlled by medicine.  Chest, back, or muscle pain.  People around you feel you are not acting correctly or are confused.  Shortness of breath or difficulty breathing.  Dizziness and fainting.  You get a rash or develop hives.  You have a decrease in urine output.  Your urine turns a dark color or changes to pink, red, or brown. Any of the following   symptoms occur over the next 10 days:  You have a temperature by mouth above 102 F (38.9 C), not controlled by medicine.  Shortness of breath.  Weakness after normal activity.  The white part of the eye turns yellow (jaundice).  You have a decrease in the amount of urine or are urinating less often.  Your  urine turns a dark color or changes to pink, red, or brown. Document Released: 08/20/2000 Document Revised: 11/15/2011 Document Reviewed: 04/08/2008 [employer] Patient Information 2013 [employer].  

## 2012-11-19 LAB — TYPE AND SCREEN

## 2012-12-05 ENCOUNTER — Encounter (HOSPITAL_COMMUNITY)
Admission: RE | Admit: 2012-12-05 | Discharge: 2012-12-05 | Disposition: A | Discharge status: Home / Self Care | Payer: PRIVATE HEALTH INSURANCE | Source: Ambulatory Visit | Attending: Hematology & Oncology | Admitting: Hematology & Oncology

## 2012-12-05 DIAGNOSIS — D649 Anemia, unspecified: Secondary | ICD-10-CM | POA: Insufficient documentation

## 2012-12-05 DIAGNOSIS — D588 Other specified hereditary hemolytic anemias: Secondary | ICD-10-CM | POA: Insufficient documentation

## 2012-12-05 DIAGNOSIS — N289 Disorder of kidney and ureter, unspecified: Secondary | ICD-10-CM | POA: Insufficient documentation

## 2012-12-06 ENCOUNTER — Emergency Department (HOSPITAL_COMMUNITY): Payer: PRIVATE HEALTH INSURANCE

## 2012-12-06 ENCOUNTER — Encounter (HOSPITAL_COMMUNITY): Payer: Self-pay | Admitting: Internal Medicine

## 2012-12-06 ENCOUNTER — Other Ambulatory Visit: Payer: Self-pay

## 2012-12-06 ENCOUNTER — Inpatient Hospital Stay (HOSPITAL_COMMUNITY)
Admission: EM | Admit: 2012-12-06 | Discharge: 2012-12-10 | DRG: 689 | Disposition: A | Discharge status: Home Health | Payer: PRIVATE HEALTH INSURANCE | Attending: Internal Medicine | Admitting: Internal Medicine

## 2012-12-06 DIAGNOSIS — E875 Hyperkalemia: Secondary | ICD-10-CM | POA: Diagnosis present

## 2012-12-06 DIAGNOSIS — M1990 Unspecified osteoarthritis, unspecified site: Secondary | ICD-10-CM | POA: Diagnosis present

## 2012-12-06 DIAGNOSIS — E538 Deficiency of other specified B group vitamins: Secondary | ICD-10-CM | POA: Diagnosis present

## 2012-12-06 DIAGNOSIS — K5909 Other constipation: Secondary | ICD-10-CM | POA: Diagnosis present

## 2012-12-06 DIAGNOSIS — D591 Autoimmune hemolytic anemia, unspecified: Secondary | ICD-10-CM | POA: Diagnosis present

## 2012-12-06 DIAGNOSIS — R112 Nausea with vomiting, unspecified: Secondary | ICD-10-CM | POA: Diagnosis present

## 2012-12-06 DIAGNOSIS — L539 Erythematous condition, unspecified: Secondary | ICD-10-CM

## 2012-12-06 DIAGNOSIS — Z66 Do not resuscitate: Secondary | ICD-10-CM | POA: Diagnosis present

## 2012-12-06 DIAGNOSIS — N390 Urinary tract infection, site not specified: Principal | ICD-10-CM | POA: Diagnosis present

## 2012-12-06 DIAGNOSIS — R739 Hyperglycemia, unspecified: Secondary | ICD-10-CM | POA: Insufficient documentation

## 2012-12-06 DIAGNOSIS — A498 Other bacterial infections of unspecified site: Secondary | ICD-10-CM | POA: Diagnosis present

## 2012-12-06 DIAGNOSIS — R4182 Altered mental status, unspecified: Secondary | ICD-10-CM

## 2012-12-06 DIAGNOSIS — R29810 Facial weakness: Secondary | ICD-10-CM | POA: Insufficient documentation

## 2012-12-06 DIAGNOSIS — Z8673 Personal history of transient ischemic attack (TIA), and cerebral infarction without residual deficits: Secondary | ICD-10-CM

## 2012-12-06 DIAGNOSIS — G9341 Metabolic encephalopathy: Secondary | ICD-10-CM | POA: Diagnosis present

## 2012-12-06 DIAGNOSIS — Z79899 Other long term (current) drug therapy: Secondary | ICD-10-CM

## 2012-12-06 DIAGNOSIS — H409 Unspecified glaucoma: Secondary | ICD-10-CM | POA: Diagnosis present

## 2012-12-06 DIAGNOSIS — I4891 Unspecified atrial fibrillation: Secondary | ICD-10-CM | POA: Insufficient documentation

## 2012-12-06 DIAGNOSIS — H9190 Unspecified hearing loss, unspecified ear: Secondary | ICD-10-CM | POA: Diagnosis present

## 2012-12-06 DIAGNOSIS — R7309 Other abnormal glucose: Secondary | ICD-10-CM | POA: Diagnosis present

## 2012-12-06 DIAGNOSIS — G9340 Encephalopathy, unspecified: Secondary | ICD-10-CM | POA: Diagnosis present

## 2012-12-06 DIAGNOSIS — D649 Anemia, unspecified: Secondary | ICD-10-CM | POA: Diagnosis present

## 2012-12-06 DIAGNOSIS — E860 Dehydration: Secondary | ICD-10-CM | POA: Diagnosis present

## 2012-12-06 DIAGNOSIS — Z950 Presence of cardiac pacemaker: Secondary | ICD-10-CM

## 2012-12-06 HISTORY — DX: Unspecified glaucoma: H40.9

## 2012-12-06 HISTORY — DX: Other autoimmune hemolytic anemias: D59.1

## 2012-12-06 HISTORY — DX: Deficiency of other specified B group vitamins: E53.8

## 2012-12-06 HISTORY — DX: Anemia, unspecified: D64.9

## 2012-12-06 HISTORY — DX: Personal history of transient ischemic attack (TIA), and cerebral infarction without residual deficits: Z86.73

## 2012-12-06 HISTORY — DX: Bradycardia, unspecified: R00.1

## 2012-12-06 HISTORY — DX: Unspecified osteoarthritis, unspecified site: M19.90

## 2012-12-06 HISTORY — DX: Personal history of (healed) traumatic fracture: Z87.81

## 2012-12-06 HISTORY — DX: Urinary tract infection, site not specified: N39.0

## 2012-12-06 LAB — CBC
HCT: 27.4 % — ABNORMAL LOW (ref 36.0–46.0)
Hemoglobin: 9.1 g/dL — ABNORMAL LOW (ref 12.0–15.0)
RBC: 2.78 MIL/uL — ABNORMAL LOW (ref 3.87–5.11)
WBC: 9.7 10*3/uL (ref 4.0–10.5)

## 2012-12-06 LAB — DIFFERENTIAL
Basophils Absolute: 0 10*3/uL (ref 0.0–0.1)
Basophils Relative: 0 % (ref 0–1)
Eosinophils Absolute: 0.1 10*3/uL (ref 0.0–0.7)
Eosinophils Relative: 1 % (ref 0–5)
Lymphocytes Relative: 7 % — ABNORMAL LOW (ref 12–46)
Monocytes Absolute: 0.3 10*3/uL (ref 0.1–1.0)
Monocytes Relative: 3 % (ref 3–12)
Neutro Abs: 8.6 10*3/uL — ABNORMAL HIGH (ref 1.7–7.7)
Neutrophils Relative %: 89 % — ABNORMAL HIGH (ref 43–77)

## 2012-12-06 LAB — URINE MICROSCOPIC-ADD ON

## 2012-12-06 LAB — COMPREHENSIVE METABOLIC PANEL
ALT: 25 U/L (ref 0–35)
AST: 72 U/L — ABNORMAL HIGH (ref 0–37)
Alkaline Phosphatase: 110 U/L (ref 39–117)
CO2: 28 mEq/L (ref 19–32)
Calcium: 9.4 mg/dL (ref 8.4–10.5)
GFR calc non Af Amer: 68 mL/min — ABNORMAL LOW (ref >90)
Potassium: 6.4 mEq/L (ref 3.5–5.1)
Sodium: 142 mEq/L (ref 135–145)
Total Protein: 6.5 g/dL (ref 6.0–8.3)

## 2012-12-06 LAB — PROTIME-INR: INR: 1 (ref 0.00–1.49)

## 2012-12-06 LAB — URINALYSIS, ROUTINE W REFLEX MICROSCOPIC
Ketones, ur: NEGATIVE mg/dL
Nitrite: NEGATIVE
Specific Gravity, Urine: 1.015 (ref 1.005–1.030)
pH: 6.5 (ref 5.0–8.0)

## 2012-12-06 LAB — RAPID URINE DRUG SCREEN, HOSP PERFORMED
Barbiturates: NOT DETECTED
Benzodiazepines: NOT DETECTED

## 2012-12-06 LAB — TROPONIN I: Troponin I: <0.3 ng/mL (ref <0.30)

## 2012-12-06 LAB — GLUCOSE, CAPILLARY: Glucose-Capillary: 140 mg/dL — ABNORMAL HIGH (ref 70–99)

## 2012-12-06 LAB — TSH: TSH: 2.522 u[IU]/mL (ref 0.350–4.500)

## 2012-12-06 LAB — APTT: aPTT: 31 seconds (ref 24–37)

## 2012-12-06 MED ORDER — ACETAMINOPHEN 325 MG PO TABS: 650.0000 mg | ORAL_TABLET | Freq: Four times a day (QID) | ORAL | Status: DC | PRN

## 2012-12-06 MED ORDER — DOCUSATE SODIUM 100 MG PO CAPS
100.0000 mg | ORAL_CAPSULE | Freq: Two times a day (BID) | ORAL | Status: DC
  Filled 2012-12-06: qty 1

## 2012-12-06 MED ORDER — SODIUM CHLORIDE 0.9 % IV SOLN
1.0000 g | Freq: Once | INTRAVENOUS | Status: AC
  Administered 2012-12-06: 1 g via INTRAVENOUS
  Filled 2012-12-06: qty 10

## 2012-12-06 MED ORDER — PANTOPRAZOLE SODIUM 40 MG IV SOLR
40.0000 mg | INTRAVENOUS | Status: DC
  Administered 2012-12-06: 40 mg via INTRAVENOUS
  Filled 2012-12-06 (×2): qty 40

## 2012-12-06 MED ORDER — ONDANSETRON HCL 4 MG PO TABS: 4.0000 mg | ORAL_TABLET | Freq: Four times a day (QID) | ORAL | Status: DC | PRN

## 2012-12-06 MED ORDER — TIMOLOL MALEATE 0.5 % OP SOLN
1.0000 [drp] | Freq: Every day | OPHTHALMIC | Status: DC
  Administered 2012-12-06 – 2012-12-10 (×5): 1 [drp] via OPHTHALMIC
  Filled 2012-12-06: qty 5

## 2012-12-06 MED ORDER — INSULIN ASPART 100 UNIT/ML ~~LOC~~ SOLN
10.0000 [IU] | Freq: Once | SUBCUTANEOUS | Status: AC
  Administered 2012-12-06: 10 [IU] via INTRAVENOUS
  Filled 2012-12-06: qty 1

## 2012-12-06 MED ORDER — MORPHINE SULFATE 2 MG/ML IJ SOLN
0.5000 mg | INTRAMUSCULAR | Status: DC | PRN
  Administered 2012-12-06 – 2012-12-10 (×2): 0.5 mg via INTRAVENOUS
  Filled 2012-12-06 (×2): qty 1

## 2012-12-06 MED ORDER — ONDANSETRON HCL 4 MG/2ML IJ SOLN: 4.0000 mg | Freq: Four times a day (QID) | INTRAMUSCULAR | Status: DC | PRN

## 2012-12-06 MED ORDER — GUAIFENESIN-DM 100-10 MG/5ML PO SYRP
5.0000 mL | ORAL_SOLUTION | ORAL | Status: DC | PRN
  Filled 2012-12-06: qty 5

## 2012-12-06 MED ORDER — SODIUM POLYSTYRENE SULFONATE 15 GM/60ML PO SUSP
15.0000 g | Freq: Once | ORAL | Status: DC
Start: 2012-12-06 — End: 2012-12-06

## 2012-12-06 MED ORDER — FOLIC ACID 1 MG PO TABS
2.0000 mg | ORAL_TABLET | Freq: Every day | ORAL | Status: DC
  Filled 2012-12-06: qty 2

## 2012-12-06 MED ORDER — ALPRAZOLAM 0.25 MG PO TABS: 1.0000 mg | ORAL_TABLET | Freq: Once | ORAL | Status: DC

## 2012-12-06 MED ORDER — OXYCODONE HCL 5 MG PO TABS: 5.0000 mg | ORAL_TABLET | ORAL | Status: DC | PRN

## 2012-12-06 MED ORDER — SODIUM POLYSTYRENE SULFONATE 15 GM/60ML PO SUSP
15.0000 g | Freq: Once | ORAL | Status: AC
  Administered 2012-12-06: 15 g via RECTAL
  Filled 2012-12-06: qty 60

## 2012-12-06 MED ORDER — SODIUM BICARBONATE 8.4 % IV SOLN
25.0000 meq | Freq: Once | INTRAVENOUS | Status: AC
  Administered 2012-12-06: 25 meq via INTRAVENOUS
  Filled 2012-12-06: qty 50

## 2012-12-06 MED ORDER — SODIUM CHLORIDE 0.45 % IV SOLN: INTRAVENOUS | Status: DC

## 2012-12-06 MED ORDER — ACETAMINOPHEN 650 MG RE SUPP: 650.0000 mg | Freq: Four times a day (QID) | RECTAL | Status: DC | PRN

## 2012-12-06 MED ORDER — CALCIUM GLUCONATE 10 % IV SOLN
1.0000 g | Freq: Once | INTRAVENOUS | Status: DC
  Filled 2012-12-06: qty 10

## 2012-12-06 MED ORDER — SODIUM CHLORIDE 0.9 % IJ SOLN: 3.0000 mL | Freq: Two times a day (BID) | INTRAMUSCULAR | Status: DC

## 2012-12-06 MED ORDER — ALPRAZOLAM 1 MG PO TABS: 1.0000 mg | ORAL_TABLET | Freq: Three times a day (TID) | ORAL | Status: DC | PRN

## 2012-12-06 MED ORDER — DEXTROSE 50 % IV SOLN
1.0000 | Freq: Once | INTRAVENOUS | Status: AC
  Administered 2012-12-06: 50 mL via INTRAVENOUS
  Filled 2012-12-06: qty 50

## 2012-12-06 MED ORDER — SODIUM CHLORIDE 0.45 % IV SOLN
INTRAVENOUS | Status: DC
  Administered 2012-12-06: 22:00:00 via INTRAVENOUS
  Filled 2012-12-06 (×4): qty 50

## 2012-12-06 MED ORDER — DEXTROSE 5 % IV SOLN
1.0000 g | INTRAVENOUS | Status: DC
  Administered 2012-12-06 – 2012-12-09 (×4): 1 g via INTRAVENOUS
  Filled 2012-12-06 (×5): qty 10

## 2012-12-06 NOTE — H&P (Signed)
[facility] History and Physical  [name] WUJ:[id number] DOB: 07/13/1915 DOA: 12/06/2012  Referring physician: ED physician, Dr. Ignacia Palma PCP: Lillia Mountain, MD  Specialists: Hematologist, Dr. Myna Hidalgo  Chief Complaint: Altered mental status.   HPI: [name] is a 79 y.o. female with a past medical history significant for cold agglutinin disease, status post pacemaker, and degenerative joint disease, who presents to the emergency department today with a report of altered mental status. The patient does not know why she is in the hospital. She keeps asking "what is wrong".  The history is being provided by her granddaughter [name] and her daughter [name]. Accordingly, the patient's home health aide noticed that the patient may have had a right-sided facial droop this morning. She appeared to be confused and not herself. She had a couple episodes of nausea and vomiting. She was seen by Dr. Jone Baseman extender today in his office. During the evaluation in the office, the patient had several episodes of nausea and vomiting with a bilious type emesis. When asked I the patient if she has abdominal pain, she states no. The family did notice that her urine was strong smelling and somewhat malodorous. Otherwise, she had no complaints of abdominal pain, fever, or pain with urination. She has chronic constipation for which she was given a laxative several days ago. She has not had a bowel movement in 2 days.   During the initial evaluation in the emergency department, a code stroke was called. Neurologist, Dr. Roseanne Reno evaluated the patient. He did not believe that she was having an acute stroke, but rather metabolic encephalopathy including hyperkalemia and possible urinary tract infection.  In the emergency department, she is afebrile and hemodynamically stable. Her lab data are significant for hemoglobin of 9.1, potassium of 6.4, AST of 72, glucose of 158 and negative troponin I. CT of  her head reveals no acute disease, but with microvascular ischemic changes and atrophy. She is being admitted for further evaluation and management.  Review of Systems: Positive for as above in history present illness. In addition, her review of systems is positive for degenerative joint pain, occasional confusion, hard of hearing, and constipation. Otherwise, ROS is negative.  Past Medical History  Diagnosis Date  . Bradycardia     S/p Pacemaker  . Cold agglutinin disease   . Anemia   . DJD (degenerative joint disease)   . Hx of fracture of right hip   . UTI (lower urinary tract infection)   . Glaucoma   . History of CVA (cerebrovascular accident)   . Vitamin B12 deficiency    History reviewed. No pertinent past surgical history. Social History: She lives in [place] with her daughter [name]. She has an aide daily for several hours. She can transfer and ambulate a little with a walker and with assistance from her family. She has no history of tobacco alcohol or illicit drug use.   No Known Allergies  Family history: The patient's parents are deceased. Etiology is unknown.  Prior to Admission medications   Medication Sig Start Date End Date Taking? Authorizing Provider  acetaminophen (TYLENOL) 325 MG tablet Take 650 mg by mouth every 6 (six) hours as needed for pain (for pain).    Yes Historical Provider, MD  cyanocobalamin 1000 MCG tablet Inject 100 mcg into the muscle every 30 (thirty) days.     Yes Historical Provider, MD  folic acid (FOLVITE) 1 MG tablet Take 2 mg by mouth daily.     Yes Historical  Provider, MD  HYDROcodone-acetaminophen (NORCO) 10-325 MG per tablet Take 1 tablet by mouth every 6 (six) hours as needed.   Yes Historical Provider, MD  Multiple Vitamin (MULTIVITAMIN) tablet Take 1 tablet by mouth daily.     Yes Historical Provider, MD  oxyCODONE-acetaminophen (PERCOCET/ROXICET) 5-325 MG per tablet 1 tablet every 8 (eight) hours as needed for pain.  08/14/12  Yes  Historical Provider, MD  timolol (TIMOPTIC) 0.5 % ophthalmic solution Place 1 drop into both eyes daily.  03/11/12  Yes Historical Provider, MD   Physical Exam: Filed Vitals:   12/06/12 1630 12/06/12 1645 12/06/12 1700 12/06/12 1715  BP: 143/62 126/62 121/70 121/47  Pulse: 74 65 71 63  Temp:      TempSrc:      Resp: 14 15 18 22   SpO2: 100% 99% 100% 98%     General: Elderly thin 79 year old African-American woman laying in bed, in no acute distress.  Eyes: Pupils are small, but equal round and reactive to light. Extraocular movements are intact. Conjunctivae are clear. Sclerae are white.  ENT: Oropharynx reveals no teeth. Because membranes are mildly dry. No posterior exudates or erythema.  Neck: Supple, no adenopathy, no thyromegaly.  Cardiovascular: S1, S2, with no murmurs rubs or gallops.  Respiratory: Clear to auscultation bilaterally.  Abdomen: Positive bowel sounds, soft, nontender, nondistended. No masses palpated.  Skin: Fair turgor. No rashes.  Musculoskeletal: Hypertrophic arthritic changes seen in both knees. No knee effusion. No acute hot red joints. No pedal edema. Pedal pulses barely palpable.  Psychiatric: She is alert and oriented to herself, hospital, and daughter. Flat affect.  Neurologic: Cranial nerves II through XII are intact with exception of hard of hearing. No obvious facial droop. No obvious dysarthria. She is able to raise each leg against gravity. She has a mildly weak handgrip bilaterally. Sensation is grossly intact.  Labs on Admission:  Basic Metabolic Panel:  Recent Labs Lab 12/06/12 1423  NA 142  K 6.4*  CL 110  CO2 28  GLUCOSE 158*  BUN 23  CREATININE 0.79  CALCIUM 9.4   Liver Function Tests:  Recent Labs Lab 12/06/12 1423  AST 72*  ALT 25  ALKPHOS 110  BILITOT 2.9*  PROT 6.5  ALBUMIN 3.1*   No results found for this basename: LIPASE, AMYLASE,  in the last 168 hours No results found for this basename: AMMONIA,  in the  last 168 hours CBC:  Recent Labs Lab 12/06/12 1335  WBC 9.7  NEUTROABS 8.6*  HGB 9.1*  HCT 27.4*  MCV 98.6  PLT 224   Cardiac Enzymes:  Recent Labs Lab 12/06/12 1423  TROPONINI <0.30    BNP (last 3 results) No results found for this basename: PROBNP,  in the last 8760 hours CBG:  Recent Labs Lab 12/06/12 1617  GLUCAP 140*    Radiological Exams on Admission: Ct Head Wo Contrast  12/06/2012  *RADIOLOGY REPORT*  Clinical Data: Altered mental status. Code stroke.  CT HEAD WITHOUT CONTRAST  Technique:  Contiguous axial images were obtained from the base of the skull through the vertex without contrast.  Comparison: CT head without contrast 05/31/2010.  Findings: Moderate generalized atrophy white matter disease has slightly progressed since the prior exam.  The no acute cortical infarct, hemorrhage, or mass lesion is present.  The ventricles are proportionate to the degree of atrophy.  Atherosclerotic calcifications are present in the cavernous carotid arteries and vertebral arteries.  A fluid level is present in the right sphenoid sinus.  The remaining paranasal sinuses and mastoid air cells are clear.  IMPRESSION:  1.  No or acute intracranial abnormality or significant interval change. 2.  Mild progression of moderate atrophy and diffuse white matter disease.  This likely reflects the sequelae of chronic microvascular ischemia. 3.  Right sphenoid sinusitis.   Original Report Authenticated By: Marin Roberts, M.D.     EKG: Atrial fibrillation, frequent PVCs, heart rate 79 beats per minute.     Assessment/Plan Principal Problem:   Encephalopathy acute Active Problems:   Facial droop   Hyperkalemia   Anemia due to chronic blood loss   Nausea and vomiting   UTI (lower urinary tract infection)   Hyperglycemia   Atrial fibrillation   1. This is a 79 year old woman who presents with a reported facial droop and altered mental status. I agree with Dr. Roseanne Reno in that it  is likely that the patient's encephalopathy and neurological changes are the manifestation of hyperkalemia and a urinary tract infection. She has no facial droop currently. Her mental status appears to be improving. Her urinalysis is suggestive of infection. Her nausea and vomiting is likely due to the urinary tract infection, though she does have a mildly elevated SGOT. Her abdomen is benign on exam. Her EKG abnormalities may be secondary to hyperkalemia. Etiology of her hyperkalemia is unknown at this time. It may be associated with her cold agglutinin disease. She is not on an ACE inhibitor/ARB or potassium chloride supplementation.       Plan: 1. The patient was given calcium gluconate, bicarbonate, insulin/glucose in the emergency department. 2. We'll continue treatment with one dose of Kayexalate and IV fluid hydration with half-normal saline with bicarbonate added. We'll monitor her potassium daily or more. 3. We'll start Rocephin IV empirically. We'll order a urine culture. 4. Will check a followup hepatic function panel in the morning. If her liver transaminases were transaminase is still elevated, consider getting an ultrasound of her abdomen. 5. We'll check a lipase today. 6. We'll start him. IV Protonix. We'll treat her nausea and vomiting as needed with Zofran. 7. We'll check a followup EKG in the morning to assess for interval changes.  8. We'll check a hemoglobin A1c and TSH. 9. I have left a message for Dr. Valentina Lucks regarding the patient's admission.  Code Status: DO NOT RESUSCITATE as discussed with her daughter [name]. Family Communication: Plan discussed with the patient's daughter [name] and granddaughter [name] Disposition Plan: Anticipate discharge in 48 hours.  Time spent: One hour  [facility] Pager [phone]  If 7PM-7AM, please contact night-coverage [email or web address] Password [facility] 12/06/2012, 5:57 PM

## 2012-12-06 NOTE — Consult Note (Signed)
Referring Physician: Ignacia Palma    Chief Complaint: Code stroke  HPI:                                                                                                                                         [name] is an 79 y.o. female who was seen at 6:30 and again at 8 AM with no abnormality.  A home health care personal found patient at 9:30 and noted she was "not herself" with question of right facial droop.  In addition it is stated she has been having diarrhea, vomiting and urine has been very dark and foul smelling for past few days. CT head was negative for acute infarct. She has a history of flexion contracture of the right elbow. Right arm seems somewhat weaker to the caretaker and family members. Lower extremities was unchanged. Patient had equivocal facial weakness, at best.  Date last known well: Today Time last known well: 8AM tPA Given: No: No new deficit and beyond time window for treatment consideration  Past medical history: 1) Bradycardia with Pacemaker 2) ? MI 3) HTN 4) B12 Deficiancy 5) equivocal old left cerebral infarction  No past surgical history on file.  No family history on file. Social History:  has no tobacco, alcohol, and drug history on file.  Allergies: No Known Allergies  Medications:                                                                                                                           No current facility-administered medications for this encounter.   Current Outpatient Prescriptions  Medication Sig Dispense Refill  . acetaminophen (TYLENOL) 325 MG tablet Take 650 mg by mouth every 6 (six) hours as needed for pain (for pain).       . cyanocobalamin 1000 MCG tablet Inject 100 mcg into the muscle every 30 (thirty) days.        . folic acid (FOLVITE) 1 MG tablet Take 2 mg by mouth daily.        Marland Kitchen HYDROcodone-acetaminophen (NORCO) 10-325 MG per tablet Take 1 tablet by mouth every 6 (six) hours as needed.      . Multiple  Vitamin (MULTIVITAMIN) tablet Take 1 tablet by mouth daily.        Marland Kitchen oxyCODONE-acetaminophen (PERCOCET/ROXICET) 5-325 MG per tablet 1 tablet every 8 (eight) hours as needed  for pain.       Marland Kitchen timolol (TIMOPTIC) 0.5 % ophthalmic solution Place 1 drop into both eyes daily.          ROS:                                                                                                                                       History obtained from granddaughter  General ROS: negative for - chills, fatigue, fever, night sweats, weight gain or weight loss Psychological ROS: negative for - behavioral disorder, hallucinations, memory difficulties, mood swings or suicidal ideation Ophthalmic ROS: negative for - blurry vision, double vision, eye pain or loss of vision ENT ROS: negative for - epistaxis, nasal discharge, oral lesions, sore throat, tinnitus or vertigo Allergy and Immunology ROS: negative for - hives or itchy/watery eyes Hematological and Lymphatic ROS: negative for - bleeding problems, bruising or swollen lymph nodes Endocrine ROS: negative for - galactorrhea, hair pattern changes, polydipsia/polyuria or temperature intolerance Respiratory ROS: negative for - cough, hemoptysis, shortness of breath or wheezing Cardiovascular ROS: negative for - chest pain, dyspnea on exertion, edema or irregular heartbeat Gastrointestinal ROS: negative for - abdominal pain, diarrhea, hematemesis, nausea/vomiting or stool incontinence Genito-Urinary ROS: negative for - dysuria, hematuria, incontinence or urinary frequency/urgency Musculoskeletal ROS: negative for - joint swelling or muscular weakness Neurological ROS: as noted in HPI Dermatological ROS: negative for rash and skin lesion changes  Neurologic Examination:                                                                                                      Blood pressure 155/89, pulse 61, resp. rate 16, SpO2 100.00%.   Mental Status: Alert, not  oriented but does follow commands to best of her ability.  Speech limited.  Able to follow simple commands without difficulty. Cranial Nerves: II: Discs flat bilaterally; Visual fields grossly normal--blinks to threat, pupils equal, round, reactive to light and accommodation III,IV, VI: ptosis not present, extra-ocular motions intact bilaterally V,VII: smile and face symmetric when held midline, facial light touch sensation normal bilaterally VIII: hearing normal bilaterally IX,X: gag reflex present XI: bilateral shoulder shrug XII: midline tongue extension Motor: Right : Upper extremity   note    Left:     Upper extremity  5/5  Lower extremity   3/5     Lower extremity   3/5 --over past month she has held her right upper extremity in flexion, it continues to be held in flexion and  show increased tone.  --patient is non-mobile at baseline, needs full assist to be transferred, bilateral LE show ability to hold antigravity and shows increased tone.  Tone and bulk:normal tone throughout; no atrophy noted Sensory: Pinprick and light touch intact throughout, bilaterally Deep Tendon Reflexes: 1+ and symmetric throughout with no AJ Plantars: Right: downgoing   Left: downgoing Cerebellar: normal finger-to-nose,  CV: pulses palpable throughout    No results found for this or any previous visit (from the past 48 hour(s)). Ct Head Wo Contrast  12/06/2012  *RADIOLOGY REPORT*  Clinical Data: Altered mental status. Code stroke.  CT HEAD WITHOUT CONTRAST  Technique:  Contiguous axial images were obtained from the base of the skull through the vertex without contrast.  Comparison: CT head without contrast 05/31/2010.  Findings: Moderate generalized atrophy white matter disease has slightly progressed since the prior exam.  The no acute cortical infarct, hemorrhage, or mass lesion is present.  The ventricles are proportionate to the degree of atrophy.  Atherosclerotic calcifications are present in the  cavernous carotid arteries and vertebral arteries.  A fluid level is present in the right sphenoid sinus.  The remaining paranasal sinuses and mastoid air cells are clear.  IMPRESSION:  1.  No or acute intracranial abnormality or significant interval change. 2.  Mild progression of moderate atrophy and diffuse white matter disease.  This likely reflects the sequelae of chronic microvascular ischemia. 3.  Right sphenoid sinusitis.   Original Report Authenticated By: Marin Roberts, M.D.     Assessment and plan discussed with with attending physician and they are in agreement.    Felicie Morn PA-C [facility] [phone]  12/06/2012, 2:08 PM   Assessment: 79 y.o. female who has been having N/V and diarrhea for last few days and poor oral intake. Likely etiology for weakness and mental status changes is metabolic encephalopathy, including hyperkalemia. Urinary tract infection cannot be ruled out at this point, as well.  Stroke Risk Factors - hypertension  Recommendations: Patient be admitted by primary care service for management of hyperkalemia as well as possible urinary tract infection. We will continue to follow her as well. Physical therapy intervention is recommended. There no clear indications for stroke workup.  Felicie Morn, PA-C [facility] [phone]  I personally participated in this patient's evaluation and management including examination, clinical assessment formulation, as well as management recommendations.  Venetia Maxon M.D. [facility] [phone]

## 2012-12-06 NOTE — ED Notes (Signed)
New family member in room at this time.

## 2012-12-06 NOTE — Code Documentation (Signed)
79 yo female who lives at home with 24 hour care.  She was LKW this am at 0800 by her caregiver.  Around 0930 she found her with slurred speech and decreased movement of her lower extremities. She also had vomiting and diarrhea today. [facility] was called and pt was transported to [facility] arriving at 1258.  She was seen by the EDP at 1310 who activated code stroke at 1317.  Stroke team arrived at 1325. NIHSS was 7 (see doc flowsheet).  Code stroke was canceled at 1415 as she is outside the window and no clear acute focal deficits. Handoff to ED RN.

## 2012-12-06 NOTE — ED Notes (Signed)
Results of chem 8 shown to Dr. Ignacia Palma, then a second chem 8 was given to Dr. Ignacia Palma, K > 9 on both.

## 2012-12-06 NOTE — Progress Notes (Signed)
Patient admitted from Ed via bed, no acute distress noted.  Telemetry showing v-paced.  Family at bedside.

## 2012-12-06 NOTE — ED Provider Notes (Signed)
History     CSN: [id number]  Arrival date & time 12/06/12  1258   First MD Initiated Contact with Patient 12/06/12 1302      Chief Complaint  Patient presents with  . Altered Mental Status    (Consider location/radiation/quality/duration/timing/severity/associated sxs/prior treatment) HPI Comments: Patient is a 79 year old woman who is normally alert talkative. She had had breakfast this morning and around 9:30 or 10 had a abrupt change of condition, where she was no longer speaking or moving around well. Her aide noted a right facial palsy. She was therefore brought to [facility] for evaluation. She has a prior history of cold gluten and hemolytic anemia, and was transfused for this last month.  Patient is a 79 y.o. female presenting with altered mental status. The history is provided by a relative, a caregiver and medical records. No language interpreter was used.  Altered Mental Status This is a new problem. The current episode started 3 to 5 hours ago. The problem occurs constantly. The problem has not changed since onset.Nothing aggravates the symptoms. Nothing relieves the symptoms. Treatments tried: Patient was brought to [facility] for evaluation by EMS.    No past medical history on file.  No past surgical history on file.  No family history on file.  History  Substance Use Topics  . Smoking status: Not on file  . Smokeless tobacco: Not on file  . Alcohol Use: Not on file    OB History   No data available      Review of Systems  Unable to perform ROS: Mental status change  Psychiatric/Behavioral: Positive for altered mental status.    Allergies  Review of patient's allergies indicates no known allergies.  Home Medications   Current Outpatient Rx  Name  Route  Sig  Dispense  Refill  . acetaminophen (TYLENOL) 325 MG tablet   Oral   Take 650 mg by mouth every 6 (six) hours as needed for pain (for pain).          . cyanocobalamin 1000 MCG tablet    Intramuscular   Inject 100 mcg into the muscle every 30 (thirty) days.           . folic acid (FOLVITE) 1 MG tablet   Oral   Take 2 mg by mouth daily.           Marland Kitchen HYDROcodone-acetaminophen (NORCO) 10-325 MG per tablet   Oral   Take 1 tablet by mouth every 6 (six) hours as needed.         . Multiple Vitamin (MULTIVITAMIN) tablet   Oral   Take 1 tablet by mouth daily.           Marland Kitchen oxyCODONE-acetaminophen (PERCOCET/ROXICET) 5-325 MG per tablet      1 tablet every 8 (eight) hours as needed for pain.          Marland Kitchen timolol (TIMOPTIC) 0.5 % ophthalmic solution   Both Eyes   Place 1 drop into both eyes daily.            BP 155/89  Pulse 61  Resp 16  SpO2 100%  Physical Exam  Nursing note and vitals reviewed. Constitutional:  Frail appearing elderly woman. She has an obvious right facial palsy. She does not speak to answer questions, but is awake, and follows commands by grasping her hand when asked to do so.  HENT:  Head: Normocephalic and atraumatic.  Right Ear: External ear normal.  Left Ear: External ear  normal.  Mouth/Throat: Oropharynx is clear and moist.  Eyes: Conjunctivae and EOM are normal. Pupils are equal, round, and reactive to light.  Neck: Normal range of motion. Neck supple.  No carotid bruit.  Cardiovascular: Normal rate, regular rhythm and normal heart sounds.   Pulmonary/Chest: Effort normal and breath sounds normal.  Abdominal: Soft. Bowel sounds are normal.  Musculoskeletal: She exhibits no edema and no tenderness.  Neurological:  Pt is awake but does not speak.  She has a right facial palsy.  She has weakness in both arms and legs, but will grip weakly with both hands.  Skin: Skin is warm and dry.  Psychiatric:  Unable to assess.    ED Course  Procedures (including critical care time)  Labs Reviewed  URINALYSIS, ROUTINE W REFLEX MICROSCOPIC  ETHANOL  PROTIME-INR  APTT  CBC  DIFFERENTIAL  COMPREHENSIVE METABOLIC PANEL  TROPONIN I   URINE RAPID DRUG SCREEN (HOSP PERFORMED)  URINALYSIS, ROUTINE W REFLEX MICROSCOPIC   Pt was seen STAT by me --> physical exam performed.  Lab workup ordered.  Code Stroke called.  2:02 PM Initial K was over 9, so requested a repeat.   2:11 PM Pt seen by Dr. Noel Christmas, neurologist, who advised that pt's CT was negative, and he feels she has had altered mental status and has not had a stroke.  Will complete her workup and plan to call [facility].   2:21 PM Repeat Istat 8 again shows K > 9.  Will get lab potassium determination, as her exam and EKG do not suggest hyperkalemia.   Date: 12/06/2012  Rate: 79  Rhythm: Electronically paced Rhythm  QRS Axis: left  Intervals: QT prolonged QRS:  LBBB due to pacing  ST/T Wave abnormalities: normal  Conduction Disutrbances:none  Narrative Interpretation: Abnormal EKG  Old EKG Reviewed: unchanged  4:07 PM Prolonged wait for CMET--on her CMET she had K of 6.4.  Will Rx with glucose, insulin, calcium and sodium bicarbonate.  Her I-STAT 8 showed Na 140, K >9,Cl 111, CO2 30, Glucose 162, BUN 37 and creatinine 1.0, HB 10.2 and Hct 30.  Her CBC is not done because she has cold agglutinins. Will call [facility] to admit her for altered mental status, hyperkalemia, cold agglutinins.  4:23 PM Case discussed with Dr. Elliot Cousin.  She will admit pt as an inpatient to a telemetry unit to [facility].   1. Altered mental status   2. Hyperkalemia   3. Cold erythema associated with cold agglutinins            Carleene Cooper III, MD 12/06/12 [phone]

## 2012-12-06 NOTE — ED Notes (Addendum)
Pt BIB PTAR from pts PCP office for AMS that started today approx 1030 per pt private sitter. Pt has rt side facial droop and leaning to right. Pt has had n/v since onset of s/s. Per pt family, pt baseline is a/o x4, lives at home with family. Family reports pts urine has been dark with foul smell for several days.

## 2012-12-07 DIAGNOSIS — D649 Anemia, unspecified: Secondary | ICD-10-CM | POA: Diagnosis present

## 2012-12-07 LAB — HEMOGLOBIN A1C
Hgb A1c MFr Bld: 6.4 % — ABNORMAL HIGH (ref <5.7)
Mean Plasma Glucose: 137 mg/dL — ABNORMAL HIGH (ref <117)

## 2012-12-07 LAB — HEPATIC FUNCTION PANEL
Alkaline Phosphatase: 101 U/L (ref 39–117)
Indirect Bilirubin: 0.9 mg/dL (ref 0.3–0.9)
Total Bilirubin: 1.1 mg/dL (ref 0.3–1.2)
Total Protein: 5.8 g/dL — ABNORMAL LOW (ref 6.0–8.3)

## 2012-12-07 LAB — BASIC METABOLIC PANEL
CO2: 29 mEq/L (ref 19–32)
Glucose, Bld: 82 mg/dL (ref 70–99)
Potassium: 3.8 mEq/L (ref 3.5–5.1)
Sodium: 145 mEq/L (ref 135–145)

## 2012-12-07 LAB — CBC
HCT: 28.2 % — ABNORMAL LOW (ref 36.0–46.0)
Hemoglobin: 9.4 g/dL — ABNORMAL LOW (ref 12.0–15.0)
MCH: 33 pg (ref 26.0–34.0)
MCV: 98.9 fL (ref 78.0–100.0)
RBC: 2.85 MIL/uL — ABNORMAL LOW (ref 3.87–5.11)

## 2012-12-07 MED ORDER — ASPIRIN 81 MG PO CHEW
CHEWABLE_TABLET | ORAL | Status: AC
  Filled 2012-12-07: qty 4

## 2012-12-07 MED ORDER — ENSURE COMPLETE PO LIQD
237.0000 mL | Freq: Two times a day (BID) | ORAL | Status: DC
  Administered 2012-12-07 – 2012-12-10 (×8): 237 mL via ORAL

## 2012-12-07 MED ORDER — SODIUM CHLORIDE 0.9 % IV SOLN
INTRAVENOUS | Status: DC
  Administered 2012-12-07 – 2012-12-08 (×2): via INTRAVENOUS

## 2012-12-07 MED ORDER — PROSIGHT PO TABS
1.0000 | ORAL_TABLET | Freq: Every day | ORAL | Status: DC
  Administered 2012-12-07 – 2012-12-10 (×4): 1 via ORAL
  Filled 2012-12-07 (×5): qty 1

## 2012-12-07 MED ORDER — DIAZEPAM 5 MG PO TABS
ORAL_TABLET | ORAL | Status: AC
  Filled 2012-12-07: qty 1

## 2012-12-07 MED ORDER — PANTOPRAZOLE SODIUM 40 MG PO TBEC
40.0000 mg | DELAYED_RELEASE_TABLET | Freq: Every day | ORAL | Status: DC
  Administered 2012-12-07 – 2012-12-10 (×4): 40 mg via ORAL
  Filled 2012-12-07 (×4): qty 1

## 2012-12-07 MED ORDER — FOLIC ACID 1 MG PO TABS
1.0000 mg | ORAL_TABLET | Freq: Every day | ORAL | Status: DC
  Administered 2012-12-07 – 2012-12-10 (×4): 1 mg via ORAL
  Filled 2012-12-07 (×4): qty 1

## 2012-12-07 NOTE — Progress Notes (Signed)
Utilization Review Completed.Krystina Strieter T4/11/2012  

## 2012-12-07 NOTE — Evaluation (Signed)
Physical Therapy Evaluation Patient Details Name: [name] MRN: [id number] DOB: 1915/06/18 Today's Date: 12/07/2012 Time: 4098-1191 PT Time Calculation (min): 20 min  PT Assessment / Plan / Recommendation Clinical Impression  79 yo female admitted with AMS. She has been total care at home.  Family she reports she was able to sit on edge of bed for short periods of time and be lifted into chair. Pt appears to be at baseline. No further PT is indicated    PT Assessment  Patent does not need any further PT services    Follow Up Recommendations       Does the patient have the potential to tolerate intense rehabilitation      Barriers to Discharge        Equipment Recommendations  None recommended by PT    Recommendations for Other Services     Frequency      Precautions / Restrictions     Pertinent Vitals/Pain No indication of pain      Mobility  Bed Mobility Bed Mobility: Rolling Right;Rolling Left;Supine to Sit Rolling Right: 3: Mod assist Rolling Left: 3: Mod assist Supine to Sit: 3: Mod assist Details for Bed Mobility Assistance: pt assist to edge of bed by pulling on pad Transfers Transfers: Squat Pivot Transfers Squat Pivot Transfers: 2: Max assist Details for Transfer Assistance: pt unable to stand fully on legs or step to chair.  She is able to accept some weight on legs to allow pivot Ambulation/Gait Ambulation/Gait Assistance: Not tested (comment)    Exercises     PT Diagnosis:    PT Problem List:   PT Treatment Interventions:     PT Goals    Visit Information  Last PT Received On: 12/07/12    Subjective Data  Subjective: granddaughter states pt hasn't walked in more than 2 years since she broke her hip Patient Stated Goal: to take her home   Prior Functioning  Home Living Lives With: Family Available Help at Discharge: Family Type of Home: House Home Access: Stairs to enter;Ramped entrance Entrance Stairs-Number of Steps: 2 Home  Layout: One level Home Adaptive Equipment: Wheelchair - manual;Walker - rolling;Bedside commode/3-in-1 Prior Function Level of Independence: Needs assistance Able to Take Stairs?: No Comments: pt needs 24 total assist at home    Cognition  Cognition Overall Cognitive Status: History of cognitive impairments - at baseline Arousal/Alertness: Awake/alert Behavior During Session: [facility] for tasks performed    Extremity/Trunk Assessment Right Lower Extremity Assessment RLE ROM/Strength/Tone: Deficits RLE ROM/Strength/Tone Deficits: diffuse muscle atrophy Left Lower Extremity Assessment LLE ROM/Strength/Tone: Deficits LLE ROM/Strength/Tone Deficits: diffuse muscle atrophy   Balance Balance Balance Assessed: Yes Static Sitting Balance Static Sitting - Balance Support: Bilateral upper extremity supported Static Sitting - Level of Assistance: 5: Stand by assistance Static Sitting - Comment/# of Minutes: pt able to sit on EOB for several minutes   End of Session PT - End of Session Activity Tolerance: Patient tolerated treatment well Patient left: in chair Nurse Communication: Mobility status  GP   Bayard Hugger. [place], [place] [phone] 12/07/2012, 1:50 PM

## 2012-12-07 NOTE — Progress Notes (Signed)
NEURO HOSPITALIST PROGRESS NOTE   SUBJECTIVE:                                                                                                                        No complaints, more alert today, able to follow more commands today  OBJECTIVE:                                                                                                                           Vital signs in last 24 hours: Temp:  [97.1 F (36.2 C)-99.1 F (37.3 C)] 99.1 F (37.3 C) (04/03 0555) Pulse Rate:  [61-85] 63 (04/03 0932) Resp:  [13-22] 17 (04/03 0932) BP: (104-155)/(47-89) 131/64 mmHg (04/03 0932) SpO2:  [96 %-100 %] 100 % (04/03 0932) Weight:  [45.859 kg (101 lb 1.6 oz)] 45.859 kg (101 lb 1.6 oz) (04/02 2213)  Intake/Output from previous day:   Intake/Output this shift:   Nutritional status: Dysphagia  Past Medical History  Diagnosis Date  . Bradycardia     S/p Pacemaker  . Cold agglutinin disease   . Anemia   . DJD (degenerative joint disease)   . Hx of fracture of right hip   . UTI (lower urinary tract infection)   . Glaucoma   . History of CVA (cerebrovascular accident)   . Vitamin B12 deficiency      Neurologic Exam:  Mental Status: Awake, able to follow simple commands, tells me her vision is bad but wears glasses, cannot tell me where she is and date.  Cranial Nerves: II: Visual fields --able to track my fingers and tell me that I am holding my fingers up but due to underlying visual difficulty cannot tell me the number of fingers.  normal, pupils equal, round, reactive to light and accommodation III,IV, VI: ptosis not present, extra-ocular motions intact bilaterally V,VII: smile symmetric, facial light touch sensation normal bilaterally VIII: hearing decreased IX,X: gag reflex present XI: bilateral shoulder shrug XII: midline tongue extension Motor: Right : Upper extremity   4-/5    Left:     Upper extremity   4-/5  Lower extremity    3/5     Lower extremity   3/5 --Le extremity weakness is chronic, moving her upper  extremities more today Tone and bulk:normal tone throughout; no atrophy noted Sensory: Pinprick and light touch intact throughout, bilaterally Deep Tendon Reflexes: 1+ and symmetric throughout no AJ Plantars: Right: downgoing   Left: downgoing Cerebellar: normal finger-to-nose,   CV: pulses palpable throughout    Lab Results: No results found for this basename: cbc, bmp, coags, chol, tri, ldl, hga1c   Lipid Panel No results found for this basename: CHOL, TRIG, HDL, CHOLHDL, VLDL, LDLCALC,  in the last 72 hours  Studies/Results: Ct Head Wo Contrast  12/06/2012  *RADIOLOGY REPORT*  Clinical Data: Altered mental status. Code stroke.  CT HEAD WITHOUT CONTRAST  Technique:  Contiguous axial images were obtained from the base of the skull through the vertex without contrast.  Comparison: CT head without contrast 05/31/2010.  Findings: Moderate generalized atrophy white matter disease has slightly progressed since the prior exam.  The no acute cortical infarct, hemorrhage, or mass lesion is present.  The ventricles are proportionate to the degree of atrophy.  Atherosclerotic calcifications are present in the cavernous carotid arteries and vertebral arteries.  A fluid level is present in the right sphenoid sinus.  The remaining paranasal sinuses and mastoid air cells are clear.  IMPRESSION:  1.  No or acute intracranial abnormality or significant interval change. 2.  Mild progression of moderate atrophy and diffuse white matter disease.  This likely reflects the sequelae of chronic microvascular ischemia. 3.  Right sphenoid sinusitis.   Original Report Authenticated By: Marin Roberts, M.D.     MEDICATIONS                                                                                                                        Scheduled: . cefTRIAXone (ROCEPHIN)  IV  1 g Intravenous Q24H  . folic acid  1 mg Oral  Daily  . multivitamin  1 tablet Oral Daily  . pantoprazole  40 mg Oral Q1200  . timolol  1 drop Both Eyes Daily    ASSESSMENT/PLAN:                                                                                                            decreased mental status.: 79 y.o. female who has been having N/V and diarrhea for last few days and poor oral intake. On admission found to have albumin 2.9 and UTI. Patient has been placed on Rocephin with improved mental status.  Likely cause of AMS is UTI in the setting of N/V/D.    Recommend : 1) Continue to treat UTI  No further neurology recommendations. Neurology will S/O   Assessment and plan discussed with with attending physician and they are in agreement.    Felicie Morn PA-C [facility] [phone]  12/07/2012, 10:28 AM

## 2012-12-07 NOTE — Progress Notes (Addendum)
INITIAL NUTRITION ASSESSMENT  DOCUMENTATION CODES Per approved criteria  -Severe malnutrition in the context of social/environmental circumstances -Underweight   INTERVENTION: 1. Ensure Complete po BID, each supplement provides 350 kcal and 13 grams of protein. 2. RD to continue to follow nutrition care plan  NUTRITION DIAGNOSIS: Inadequate oral intake related to advanced age and acute encephalopathy as evidenced by underweight status and variable oral intake.   Goal: Intake to meet >90% of estimated nutrition needs.  Monitor:  weight trends, lab trends, I/O's, PO intake, supplement tolerance  Reason for Assessment: Health History  79 y.o. female  Admitting Dx: Encephalopathy acute  ASSESSMENT: Admitted with facial droop, confusion, n/v. Hx of chronic constipation. Work-up reveals metabolic encephalopathy including hyperkalemia and ? UTI.   Lives with daughter at baseline. Has an aid to help her daily. Able to transfer and ambulate a little with walker.  BSE completed this morning by SLP. Daughter reports pt has been coughing some with foods at home, but tolerating liquids well. SLP recommendations - Dysphagia 1 (puree) with thin liquids. Pt with Ensure Complete on bedside table, has consumed 50% of Ensure this morning.  Nutrition Focused Physical Exam:  Subcutaneous Fat:  Orbital Region: moderate Upper Arm Region: severe Thoracic and Lumbar Region: n/a  Muscle:  Temple Region: severe Clavicle Bone Region: severe Clavicle and Acromion Bone Region: n/a Scapular Bone Region: n/a Dorsal Hand: n/a Patellar Region: n/a Anterior Thigh Region: severe Posterior Calf Region: severe  Edema: n/a  Pt meets criteria for severe MALNUTRITION in the context of social/environmental circumstances as evidenced by severe body fat and muscle mass loss.  Pt is at nutrition risk given her current weight is 81% of usual body weight.   Height: Ht Readings from Last 1 Encounters:   12/06/12 5\' 5"  (1.651 m)    Weight: Wt Readings from Last 1 Encounters:  12/06/12 101 lb 1.6 oz (45.859 kg)    Ideal Body Weight: 125 lb  % Ideal Body Weight: 81%  Wt Readings from Last 10 Encounters:  12/06/12 101 lb 1.6 oz (45.859 kg)    Usual Body Weight: n/a  % Usual Body Weight: n/a  BMI:  Body mass index is 16.82 kg/(m^2). Underweight.  Estimated Nutritional Needs: Kcal: 1200 - 1400 Protein: 50 - 60 grams Fluid: 1.2 - 1.4 liters  Skin: L and R hip pressure ulcer  Diet Order: Dysphagia 1 with thins liquids; extra gravy and sauce  EDUCATION NEEDS: -No education needs identified at this time   Intake/Output Summary (Last 24 hours) at 12/07/12 1012 Last data filed at 12/07/12 0848  Gross per 24 hour  Intake      0 ml  Output      0 ml  Net      0 ml    Last BM: 4/2  Labs:   Recent Labs Lab 12/06/12 1423 12/07/12 0648  NA 142 145  K 6.4* 3.8  CL 110 110  CO2 28 29  BUN 23 23  CREATININE 0.79 0.80  CALCIUM 9.4 9.2  GLUCOSE 158* 82    CBG (last 3)   Recent Labs  12/06/12 1617  GLUCAP 140*    Scheduled Meds: . cefTRIAXone (ROCEPHIN)  IV  1 g Intravenous Q24H  . folic acid  1 mg Oral Daily  . multivitamin  1 tablet Oral Daily  . pantoprazole  40 mg Oral Q1200  . timolol  1 drop Both Eyes Daily    Continuous Infusions: . sodium chloride  Past Medical History  Diagnosis Date  . Bradycardia     S/p Pacemaker  . Cold agglutinin disease   . Anemia   . DJD (degenerative joint disease)   . Hx of fracture of right hip   . UTI (lower urinary tract infection)   . Glaucoma   . History of CVA (cerebrovascular accident)   . Vitamin B12 deficiency     History reviewed. No pertinent past surgical history.  Jarold Motto MS, RD, LDN Pager: [phone] After-hours pager: [phone]

## 2012-12-07 NOTE — Evaluation (Signed)
Clinical/Bedside Swallow Evaluation Patient Details  Name: [name] MRN: [id number] Date of Birth: [birth year]  Today's Date: 12/07/2012 Time: 0850-0929 SLP Time Calculation (min): 39 min  Past Medical History:  Past Medical History  Diagnosis Date  . Bradycardia     S/p Pacemaker  . Cold agglutinin disease   . Anemia   . DJD (degenerative joint disease)   . Hx of fracture of right hip   . UTI (lower urinary tract infection)   . Glaucoma   . History of CVA (cerebrovascular accident)   . Vitamin B12 deficiency    Past Surgical History: History reviewed. No pertinent past surgical history. HPI:  79 yo female adm to [facility] after mental status change prior to admission, ? right sided facial droop, ? CVA.  Heat CT negative for acute intracranial abnormality, MD documented pt with ? UTI and metabolic encephalopathy.  Pt "failed" an RN stroke swallow screen and therefore speech swallow eval was ordered.  .    Assessment / Plan / Recommendation Clinical Impression  Pt presents with clinical indications of oral dysphagia with lingual thrusting noted and decreased oral coordination resulting in delayed oral transiting.  Pt was unable to "gum" a softened graham cracker due to her dysphagia and her lack of dentition.  Per conversation with daughter on the phone, pt has been coughing some with foods at home -tolerating liquids well.  SLP educated daughter and granddaughter to aspiration precautions, diet modifications and compensatory strategies to minimize aspiration risk.    Rec puree/thin - extra gravy/sauce diet with strict precautions.  SLP to follow up x1 for family education and to assure tolerance.      Aspiration Risk  Mild    Diet Recommendation Dysphagia 1 (Puree);Thin liquid   Liquid Administration via: Straw;Cup Medication Administration: Whole meds with puree Supervision: Staff feed patient Compensations: Slow rate;Small sips/bites;Check for pocketing Postural Changes and/or  Swallow Maneuvers: Seated upright 90 degrees;Upright 30-60 min after meal    Other  Recommendations Oral Care Recommendations: Oral care QID   Follow Up Recommendations  None    Frequency and Duration min 1 x/week  1 week   Pertinent Vitals/Pain Febrile, decreased lung sounds    SLP Swallow Goals Patient will utilize recommended strategies during swallow to increase swallowing safety with: Total assistance   Swallow Study Prior Functional Status    consumed softer foods at home, coughing "some" with foods per daughter over the phone    General HPI: 79 yo female adm to [facility] after mental status change prior to admission, ? right sided facial droop, ? CVA.  Heat CT negative for acute intracranial abnormality, MD documented pt with ? UTI and metabolic encephalopathy.  Pt "failed" an RN stroke swallow screen and therefore speech swallow eval was ordered.  .  Type of Study: Bedside swallow evaluation Diet Prior to this Study: Regular;Thin liquids Temperature Spikes Noted: Yes History of Recent Intubation: No Behavior/Cognition: Alert;Decreased sustained attention Oral Cavity - Dentition: Edentulous (single upper tooth) Self-Feeding Abilities: Total assist Patient Positioning: Upright in bed Baseline Vocal Quality: Clear Volitional Cough: Weak Volitional Swallow: Able to elicit    Oral/Motor/Sensory Function Overall Oral Motor/Sensory Function: Appears within functional limits for tasks assessed (generalized weakness, no focal cn deficits observed)   Ice Chips Ice chips: Impaired Oral Phase Impairments: Reduced lingual movement/coordination;Impaired anterior to posterior transit Oral Phase Functional Implications: Prolonged oral transit Pharyngeal Phase Impairments: Suspected delayed Swallow   Thin Liquid Thin Liquid: Impaired Presentation: Straw;Cup Oral Phase Impairments: Reduced  lingual movement/coordination;Impaired anterior to posterior transit Oral Phase Functional Implications:  Prolonged oral transit Other Comments: lingual thrust anteriorally noted    Nectar Thick Nectar Thick Liquid: Impaired Presentation: Straw Oral Phase Impairments: Reduced lingual movement/coordination Oral phase functional implications: Prolonged oral transit Other Comments: lingual thrust anteriorally noted   Honey Thick Honey Thick Liquid: Not tested   Puree Puree: Impaired Presentation: Spoon Oral Phase Impairments: Reduced lingual movement/coordination;Impaired anterior to posterior transit Oral Phase Functional Implications: Prolonged oral transit Other Comments: lingual thrust anteriorally noted, pt accepts only small bites - approx 1/2 tsp   Solid   GO    Solid: Impaired Oral Phase Impairments: Reduced labial seal Other Comments: softened cracker, pt unable to masticate/"gum" a softened graham cracker        Donavan Burnet, MS [facility] SLP [phone]

## 2012-12-07 NOTE — Progress Notes (Signed)
Subjective: No new complaints  Objective: Vital signs in last 24 hours: Temp:  [97.1 F (36.2 C)-99.1 F (37.3 C)] 99.1 F (37.3 C) (04/03 0555) Pulse Rate:  [61-85] 75 (04/03 0555) Resp:  [13-22] 18 (04/03 0555) BP: (104-155)/(47-89) 113/61 mmHg (04/03 0555) SpO2:  [96 %-100 %] 96 % (04/03 0555) Weight:  [45.859 kg (101 lb 1.6 oz)] 45.859 kg (101 lb 1.6 oz) (04/02 2213) Weight change:  Last BM Date: 12/06/12  Intake/Output from previous day:   Intake/Output this shift:    Resp: clear to auscultation bilaterally Cardio: regular rate and rhythm, S1, S2 normal, no murmur, click, rub or gallop  Lab Results:  Recent Labs  12/06/12 1335  WBC 9.7  HGB 9.1*  HCT 27.4*  PLT 224   BMET  Recent Labs  12/06/12 1423  NA 142  K 6.4*  CL 110  CO2 28  GLUCOSE 158*  BUN 23  CREATININE 0.79  CALCIUM 9.4    Studies/Results: Ct Head Wo Contrast  12/06/2012  *RADIOLOGY REPORT*  Clinical Data: Altered mental status. Code stroke.  CT HEAD WITHOUT CONTRAST  Technique:  Contiguous axial images were obtained from the base of the skull through the vertex without contrast.  Comparison: CT head without contrast 05/31/2010.  Findings: Moderate generalized atrophy white matter disease has slightly progressed since the prior exam.  The no acute cortical infarct, hemorrhage, or mass lesion is present.  The ventricles are proportionate to the degree of atrophy.  Atherosclerotic calcifications are present in the cavernous carotid arteries and vertebral arteries.  A fluid level is present in the right sphenoid sinus.  The remaining paranasal sinuses and mastoid air cells are clear.  IMPRESSION:  1.  No or acute intracranial abnormality or significant interval change. 2.  Mild progression of moderate atrophy and diffuse white matter disease.  This likely reflects the sequelae of chronic microvascular ischemia. 3.  Right sphenoid sinusitis.   Original Report Authenticated By: Marin Roberts, M.D.      Medications: I have reviewed the patient's current medications.  Assessment/Plan: Principal Problem:   Encephalopathy acute resolved, at baseline this am Active Problems:   Nausea and vomiting resolved, ?acute gastroenteritis   Hyperkalemia treated with kayexalate enema, rechecking this am   UTI (lower urinary tract infection) minor pyuria, on empiric rocephin, f/u urine culture   Anemia chronic, cold agglutinin disease,   LOS: 1 day   [name] 12/07/2012, 7:29 AM

## 2012-12-07 NOTE — Progress Notes (Signed)
Kayexalate enema given. Patient was unable to retain it for long. Approximately . Had a medium size BM, formed, and brown in color.

## 2012-12-08 LAB — BASIC METABOLIC PANEL
BUN: 27 mg/dL — ABNORMAL HIGH (ref 6–23)
Creatinine, Ser: 0.83 mg/dL (ref 0.50–1.10)
GFR calc Af Amer: 67 mL/min — ABNORMAL LOW (ref >90)
GFR calc non Af Amer: 58 mL/min — ABNORMAL LOW (ref >90)
Potassium: 5.4 mEq/L — ABNORMAL HIGH (ref 3.5–5.1)

## 2012-12-08 LAB — CBC
MCHC: 33.3 g/dL (ref 30.0–36.0)
Platelets: 215 10*3/uL (ref 150–400)
RDW: 17 % — ABNORMAL HIGH (ref 11.5–15.5)

## 2012-12-08 NOTE — Progress Notes (Signed)
Subjective: Pt awake c/o some back pain No N/V  Objective: Vital signs in last 24 hours: Temp:  [98 F (36.7 C)-98.8 F (37.1 C)] 98.4 F (36.9 C) (04/04 0450) Pulse Rate:  [63-74] 64 (04/04 0450) Resp:  [16-18] 16 (04/04 0450) BP: (106-133)/(59-86) 106/59 mmHg (04/04 0450) SpO2:  [94 %-100 %] 94 % (04/04 0450) Weight:  [46.448 kg (102 lb 6.4 oz)] 46.448 kg (102 lb 6.4 oz) (04/03 2117) Weight change: 0.59 kg (1 lb 4.8 oz) Last BM Date: 12/06/12  Intake/Output from previous day: 04/03 0701 - 04/04 0700 In: 1270 [P.O.:660; I.V.:550] Out: -  Intake/Output this shift:    General appearance: alert Resp: clear to auscultation bilaterally Cardio: regular rate and rhythm GI: soft, non-tender; bowel sounds normal; no masses,  no organomegaly  Lab Results:  Recent Labs  12/06/12 1335 12/07/12 0648  WBC 9.7 7.1  HGB 9.1* 9.4*  HCT 27.4* 28.2*  PLT 224 220   BMET  Recent Labs  12/07/12 0648 12/08/12 0612  NA 145 143  K 3.8 5.4*  CL 110 110  CO2 29 28  GLUCOSE 82 110*  BUN 23 27*  CREATININE 0.80 0.83  CALCIUM 9.2 8.9    Studies/Results: Ct Head Wo Contrast  12/06/2012  *RADIOLOGY REPORT*  Clinical Data: Altered mental status. Code stroke.  CT HEAD WITHOUT CONTRAST  Technique:  Contiguous axial images were obtained from the base of the skull through the vertex without contrast.  Comparison: CT head without contrast 05/31/2010.  Findings: Moderate generalized atrophy white matter disease has slightly progressed since the prior exam.  The no acute cortical infarct, hemorrhage, or mass lesion is present.  The ventricles are proportionate to the degree of atrophy.  Atherosclerotic calcifications are present in the cavernous carotid arteries and vertebral arteries.  A fluid level is present in the right sphenoid sinus.  The remaining paranasal sinuses and mastoid air cells are clear.  IMPRESSION:  1.  No or acute intracranial abnormality or significant interval change. 2.   Mild progression of moderate atrophy and diffuse white matter disease.  This likely reflects the sequelae of chronic microvascular ischemia. 3.  Right sphenoid sinusitis.   Original Report Authenticated By: Marin Roberts, M.D.     Medications: I have reviewed the patient's current medications.  Assessment/Plan: Altered MS/ improving-  UTI- Culture still pending- continue Rocephin- wbc normal Hyperkalemia- K mild high watch K Dehyration- IVF HTN- BP low- hold BP meds Anemia- stable Disp: PT/ ? Home with HHN/ vs SNF short term soical work consult   LOS: 2 days   [name] 12/08/2012, 7:44 AM

## 2012-12-08 NOTE — Clinical Social Work Note (Signed)
CSW talked with patient's daughter Yatzari Jonsson ([phone]) regarding SNF placement and she indicated that they plan is for patient to discharge home as they have CAP services in place 5 days a week for patient and patient is cared for appropriately at home. Per Ms. Hassan, the patient lives with her sister Bennetta [place]. CSW signing off as family's plan is for patient to come home once discharged. Please re-consult if any other CSW services needed.  Genelle Bal, MSW, LCSW [phone]

## 2012-12-08 NOTE — Progress Notes (Signed)
Speech Language Pathology Dysphagia Treatment Patient Details Name: [name] MRN: [id number] DOB: 1915-01-26 Today's Date: 12/08/2012 Time: 7829-5621 SLP Time Calculation (min): 15 min  Assessment / Plan / Recommendation Clinical Impression  Skilled dysphagia treatment completed to determine tolerance of diet and benefit of compensatory strategy usage.  Pt observed being fed by Nurse tech April, no clinical indications of aspiration or penetration.  Lingual thrust continuing with delayed oral transit, suspect pharyngeal swallow is strong.  Intake documented as 75% and pt is afebrile with lung sounds decreased.  Rec continue diet with full assist encouraging po intake, pt is a laborious feeder due to her level of oral dysphagia.  SLP left written tips/compensatory strategies for family along with SLP contact number.  SLP to sign off as airway protection and intake is adequate with current diet, please reorder if desire.  Thanks.     Diet Recommendation  Continue with Current Diet: Dysphagia 1 (puree);Thin liquid    SLP Plan All goals met   Pertinent Vitals/Pain Afebrile, decreased   Swallowing Goals  SLP Swallowing Goals Swallow Study Goal #2 - Progress: Met  General Temperature Spikes Noted: No Respiratory Status: Room air Behavior/Cognition: Alert;Decreased sustained attention;Hard of hearing Oral Cavity - Dentition: Edentulous Patient Positioning: Upright in bed  Oral Cavity - Oral Hygiene   pt being fed by nurse technician  Dysphagia Treatment Treatment focused on: Skilled observation of diet tolerance Treatment Methods/Modalities: Skilled observation Patient observed directly with PO's: Yes Type of PO's observed: Dysphagia 1 (puree);Thin liquids Feeding: Total assist Liquids provided via: Cup Oral Phase Signs & Symptoms: Prolonged bolus formation;Prolonged oral phase Pharyngeal Phase Signs & Symptoms: Suspected delayed swallow initiation Type of cueing:  Verbal Amount of cueing: Total   GO     Donavan Burnet, MS [facility] SLP [phone]

## 2012-12-09 LAB — CBC
MCH: 36.4 pg — ABNORMAL HIGH (ref 26.0–34.0)
Platelets: 200 10*3/uL (ref 150–400)
RBC: 2.5 MIL/uL — ABNORMAL LOW (ref 3.87–5.11)
WBC: 6.8 10*3/uL (ref 4.0–10.5)

## 2012-12-09 LAB — BASIC METABOLIC PANEL
GFR calc Af Amer: 81 mL/min — ABNORMAL LOW (ref >90)
GFR calc non Af Amer: 70 mL/min — ABNORMAL LOW (ref >90)
Potassium: 7.2 mEq/L (ref 3.5–5.1)
Sodium: 139 mEq/L (ref 135–145)

## 2012-12-09 LAB — POTASSIUM: Potassium: 4.7 mEq/L (ref 3.5–5.1)

## 2012-12-09 LAB — URINE CULTURE

## 2012-12-09 MED ORDER — CIPROFLOXACIN HCL 500 MG PO TABS: 500.0000 mg | ORAL_TABLET | Freq: Two times a day (BID) | ORAL | Status: DC

## 2012-12-09 NOTE — Progress Notes (Signed)
Subjective: Pt awake alert- at baseline No c/o  Objective: Vital signs in last 24 hours: Temp:  [97.5 F (36.4 C)-98.3 F (36.8 C)] 98.3 F (36.8 C) (04/05 0503) Pulse Rate:  [60-76] 76 (04/05 0503) Resp:  [18] 18 (04/05 0503) BP: (118-138)/(50-79) 118/78 mmHg (04/05 0503) SpO2:  [97 %-100 %] 100 % (04/05 0503) Weight:  [46.448 kg (102 lb 6.4 oz)] 46.448 kg (102 lb 6.4 oz) (04/04 2028) Weight change: 0 kg (0 lb) Last BM Date: 12/07/12  Intake/Output from previous day: 04/04 0701 - 04/05 0700 In: 1840 [P.O.:1140; I.V.:550; IV Piggyback:150] Out: -  Intake/Output this shift:    General appearance: alert Resp: clear to auscultation bilaterally Cardio: regular rate and rhythm GI: soft, non-tender; bowel sounds normal; no masses,  no organomegaly  Lab Results:  Recent Labs  12/07/12 0648 12/08/12 0612  WBC 7.1 7.1  HGB 9.4* 8.5*  HCT 28.2* 25.5*  PLT 220 215   BMET  Recent Labs  12/08/12 0612 12/09/12 0536  NA 143 139  K 5.4* PENDING  CL 110 110  CO2 28 25  GLUCOSE 110* 85  BUN 27* 22  CREATININE 0.83 0.73  CALCIUM 8.9 8.4    Studies/Results: No results found.  Medications: I have reviewed the patient's current medications.  Assessment/Plan: Altered MS/ improved- at baseline UTI- Culture e coli- sensitivity pending- continue Rocephin- wbc normal  Hyperkalemia- K mild high watch K  Dehyration- resolve- IVF- heplock  HTN- BP ok  Anemia- stable  Disp: PT- Home with HHN/ CAP- probable 1- 2 days   LOS: 3 days   [name] 12/09/2012, 7:40 AM

## 2012-12-10 LAB — BASIC METABOLIC PANEL
CO2: 29 mEq/L (ref 19–32)
Calcium: 9.1 mg/dL (ref 8.4–10.5)
Chloride: 110 mEq/L (ref 96–112)
Sodium: 142 mEq/L (ref 135–145)

## 2012-12-10 MED ORDER — CIPROFLOXACIN HCL 500 MG PO TABS: 500.0000 mg | ORAL_TABLET | Freq: Two times a day (BID) | ORAL | Status: DC

## 2012-12-10 NOTE — Discharge Summary (Signed)
Physician Discharge Summary  Patient ID: [name] MRN: [id number] DOB/AGE: 04-29-1915 79 y.o.  Admit date: 12/06/2012 Discharge date: 12/10/2012  Admission Diagnoses:  Discharge Diagnoses:  Principal Problem:   Encephalopathy acute Active Problems:   Nausea and vomiting   Hyperkalemia   UTI (lower urinary tract infection)   Anemia   Discharged Condition: good  Hospital Course: 72 female admit with altered MS, n/V/.- improved with UTI tx and fluids. Change in MS: encephalopathy- metabolic- UTI, dehydration: ct head negative UTI: e coli on culture tx with rocephin in hospital- change to Cipro Po - culture senitive. Normal WBC. No fever N/V: due ti UTI- resolved- fluids. Po intake good- with dysphagia diet- speech eval  Done in hospital hyperkelemia- repeat K - normal. BP- ok Anemia chronic  continue folate. HGB stable 9.1 HHN/ PT     Consults: None  Significant Diagnostic Studies: labs: WBC normal HGB 9.1; K 4.3, microbiology: urine culture: positive for e coli and radiology: CXR: normal and CT scan: negative  Treatments: IV hydration and antibiotics: ceftriaxone  Discharge Exam: Blood pressure 136/55, pulse 63, temperature 98.6 F (37 C), temperature source Oral, resp. rate 18, height 5\' 5"  (1.651 m), weight 46.448 kg (102 lb 6.4 oz), SpO2 100.00%. General appearance: alert Cardio: regular rate and rhythm GI: soft, non-tender; bowel sounds normal; no masses,  no organomegaly  Disposition:   Discharge Orders   Future Appointments Provider Department Dept Phone   12/11/2012 12:00 PM Rachael Fee [facility] [phone]   12/11/2012 12:15 PM Eunice Blase, PA-C [facility] [phone]   12/11/2012 12:45 PM [facility] Nurse [facility] [phone]   Future Orders Complete By Expires     Diet - low sodium heart healthy  As directed     Increase activity slowly  As directed         Medication List    STOP taking these medications       oxyCODONE-acetaminophen 5-325 MG per tablet  Commonly known as:  PERCOCET/ROXICET      TAKE these medications       acetaminophen 325 MG tablet  Commonly known as:  TYLENOL  Take 650 mg by mouth every 6 (six) hours as needed for pain (for pain).     ciprofloxacin 500 MG tablet  Commonly known as:  CIPRO  Take 1 tablet (500 mg total) by mouth 2 (two) times daily.     cyanocobalamin 1000 MCG tablet  Inject 100 mcg into the muscle every 30 (thirty) days.     folic acid 1 MG tablet  Commonly known as:  FOLVITE  Take 2 mg by mouth daily.     HYDROcodone-acetaminophen 10-325 MG per tablet  Commonly known as:  NORCO  Take 1 tablet by mouth every 6 (six) hours as needed.     multivitamin tablet  Take 1 tablet by mouth daily.     timolol 0.5 % ophthalmic solution  Commonly known as:  TIMOPTIC  Place 1 drop into both eyes daily.           Follow-up Information   Follow up with Lillia Mountain, MD.   Contact information:   [address], [address] [facility], [place] [address] [phone]     d/c total time: 40 min.  SignedGeorgann Housekeeper 12/10/2012, 9:23 AM

## 2012-12-11 ENCOUNTER — Ambulatory Visit (HOSPITAL_BASED_OUTPATIENT_CLINIC_OR_DEPARTMENT_OTHER): Payer: PRIVATE HEALTH INSURANCE | Admitting: Medical

## 2012-12-11 ENCOUNTER — Ambulatory Visit (HOSPITAL_BASED_OUTPATIENT_CLINIC_OR_DEPARTMENT_OTHER): Payer: Medicaid Other

## 2012-12-11 ENCOUNTER — Ambulatory Visit (HOSPITAL_BASED_OUTPATIENT_CLINIC_OR_DEPARTMENT_OTHER): Payer: PRIVATE HEALTH INSURANCE | Admitting: Lab

## 2012-12-11 VITALS — BP 135/71 | HR 70 | Temp 97.3°F | Resp 16 | Ht 63.0 in

## 2012-12-11 DIAGNOSIS — N289 Disorder of kidney and ureter, unspecified: Secondary | ICD-10-CM

## 2012-12-11 DIAGNOSIS — D500 Iron deficiency anemia secondary to blood loss (chronic): Secondary | ICD-10-CM

## 2012-12-11 DIAGNOSIS — D649 Anemia, unspecified: Secondary | ICD-10-CM

## 2012-12-11 LAB — CBC WITH DIFFERENTIAL (CANCER CENTER ONLY)
BASO#: 0 10*3/uL (ref 0.0–0.2)
EOS%: 6 % (ref 0.0–7.0)
HCT: UNDETERMINED % (ref 34.8–46.6)
HGB: 9.3 g/dL — ABNORMAL LOW (ref 11.6–15.9)
LYMPH#: 1.6 10*3/uL (ref 0.9–3.3)
LYMPH%: 23.2 % (ref 14.0–48.0)
MCH: UNDETERMINED pg (ref 26.0–34.0)
MCHC: UNDETERMINED g/dL (ref 32.0–36.0)
MCV: UNDETERMINED fL (ref 81–101)
NEUT%: 60.6 % (ref 39.6–80.0)
RDW: UNDETERMINED % (ref 11.1–15.7)

## 2012-12-11 MED ORDER — DARBEPOETIN ALFA-POLYSORBATE 300 MCG/0.6ML IJ SOLN
300.0000 ug | Freq: Once | INTRAMUSCULAR | Status: AC
  Administered 2012-12-11: 300 ug via SUBCUTANEOUS

## 2012-12-11 NOTE — Progress Notes (Signed)
Diagnoses: #1.  Cold agglutinin hemolytic anemia.  #2.  Renal insufficiency with anemia.  Current therapy: #1.  Folic acid 2 mg by mouth daily.   #2.  Aranesp 300 mcg subcutaneous as needed for hemoglobin less than 10.  Interim history: [name] presents today for an office followup visit.  Her aide accompanies her.  She is 79.  She was recently admitted to the hospital on April 2 and discharged on 12/10/2012.  Apparently, she was admitted with altered mental status, nausea, vomiting, and UTI.  He was rehydrated, and given IV antibiotics.  She also had some hypokalemia, and her potassium was repleted.  Her EGOG performance status is a 3-4.  She does have renal insufficiency and we give her Aranesp for hemoglobin less than 10. She states, that she has a decent, appetite.  She's not having any nausea, vomiting, diarrhea, constipation.  She denies any chest pain, shortness of breath, or cough.  She denies any fevers, chills, or night sweats.  She denies any headaches, visual changes, or rashes.  She denies any obvious, or abnormal bleeding.  She does require help with her activities of daily living.  She denies any lower leg swelling.  Review of Systems: Constitutional:Negative for malaise/fatigue, fever, chills, weight loss, diaphoresis, activity change, appetite change, and unexpected weight change.  HEENT: Negative for double vision, blurred vision, visual loss, ear pain, tinnitus, congestion, rhinorrhea, epistaxis sore throat or sinus disease, oral pain/lesion, tongue soreness Respiratory: Negative for cough, chest tightness, shortness of breath, wheezing and stridor.  Cardiovascular: Negative for chest pain, palpitations, leg swelling, orthopnea, PND, DOE or claudication Gastrointestinal: Negative for nausea, vomiting, abdominal pain, diarrhea, constipation, blood in stool, melena, hematochezia, abdominal distention, anal bleeding, rectal pain, anorexia and hematemesis.  Genitourinary: Negative  for dysuria, frequency, hematuria,  Musculoskeletal: Negative for myalgias, back pain, joint swelling, arthralgias and gait problem.  Skin: Negative for rash, color change, pallor and wound.  Neurological:. Negative for dizziness/light-headedness, tremors, seizures, syncope, facial asymmetry, speech difficulty, weakness, numbness, headaches and paresthesias.  Hematological: Negative for adenopathy. Does not bruise/bleed easily.  Psychiatric/Behavioral:  Negative for depression, no loss of interest in normal activity or change in sleep pattern.   Physical Exam:  This is an elderly, frail, 79 year old, African American female, in no obvious distress Vitals: Temperature 97.3 degrees, pulse 70, respirations 16, blood pressure 135/71, weight was not taken today.  She is in a wheelchair. HEENT reveals a normocephalic, atraumatic skull, no scleral icterus, no oral lesions  Neck is supple without any cervical or supraclavicular adenopathy.  Lungs are clear to auscultation bilaterally. There are no wheezes, rales or rhonci Cardiac is regular rate and rhythm with a normal S1 and S2. There are no murmurs, rubs, or bruits.  Abdomen is soft with good bowel sounds, there is no palpable mass. There is no palpable hepatosplenomegaly. There is no palpable fluid wave.  Musculoskeletal no tenderness of the spine, ribs, or hips.  Extremities there are no clubbing, cyanosis, or edema.  Skin no petechia, purpura or ecchymosis Neurologic is nonfocal.  Laboratory Data: White count 6.9, hemoglobin 9.3, hematocrit and able to detect, and platelets 205,000   Current Outpatient Prescriptions on File Prior to Visit  Medication Sig Dispense Refill  . acetaminophen (TYLENOL) 325 MG tablet Take 650 mg by mouth every 6 (six) hours as needed.        . cyanocobalamin 1000 MCG tablet Inject 100 mcg into the muscle every 30 (thirty) days.        Marland Kitchen  folic acid (FOLVITE) 1 MG tablet Take 2 mg by mouth daily.        Marland Kitchen  HYDROcodone-acetaminophen (NORCO) 10-325 MG per tablet Take 1 tablet by mouth every 6 (six) hours as needed.      . Multiple Vitamin (MULTIVITAMIN) tablet Take 1 tablet by mouth daily.        Marland Kitchen oxyCODONE-acetaminophen (PERCOCET/ROXICET) 5-325 MG per tablet 1 tablet every 8 (eight) hours as needed.       . sennosides-docusate sodium (SENOKOT-S) 8.6-50 MG tablet Take 1 tablet by mouth daily.        . timolol (TIMOPTIC) 0.5 % ophthalmic solution Place 1 drop into both eyes daily.        Assessment/Plan: This is a frail, 79 year old, African, female, with the following issues:  #1.  Chronic, low-grade hemolytic anemia.  This is secondary to cold agglutinin disease.  Her hemoglobin is actually stable.  Today.  She will not need any type of blood transfusion.  #2.  Renal insufficiency with anemia.  We did give her Aranesp for hemoglobin below 10.  We will go ahead and give her an Aranesp injection today.  #3.  Followup.  We will follow back up with her in about one month, but before then should there be questions or concerns.

## 2012-12-11 NOTE — Patient Instructions (Signed)
Darbepoetin Alfa injection What is this medicine? DARBEPOETIN ALFA (dar be POE e tin AL fa) helps your body make more red blood cells. It is used to treat anemia caused by chronic kidney failure and chemotherapy. This medicine may be used for other purposes; ask your health care provider or pharmacist if you have questions. What should I tell my health care provider before I take this medicine? They need to know if you have any of these conditions: -blood clotting disorders or history of blood clots -cancer patient not on chemotherapy -cystic fibrosis -heart disease, such as angina, heart failure, or a history of a heart attack -hemoglobin level of 12 g/dL or greater -high blood pressure -low levels of folate, iron, or vitamin B12 -seizures -an unusual or allergic reaction to darbepoetin, erythropoietin, albumin, hamster proteins, latex, other medicines, foods, dyes, or preservatives -pregnant or trying to get pregnant -breast-feeding How should I use this medicine? This medicine is for injection into a vein or under the skin. It is usually given by a health care professional in a hospital or clinic setting. If you get this medicine at home, you will be taught how to prepare and give this medicine. Do not shake the solution before you withdraw a dose. Use exactly as directed. Take your medicine at regular intervals. Do not take your medicine more often than directed. It is important that you put your used needles and syringes in a special sharps container. Do not put them in a trash can. If you do not have a sharps container, call your pharmacist or healthcare provider to get one. Talk to your pediatrician regarding the use of this medicine in children. While this medicine may be used in children as young as 1 year for selected conditions, precautions do apply. Overdosage: If you think you have taken too much of this medicine contact a poison control center or emergency room at once. NOTE:  This medicine is only for you. Do not share this medicine with others. What if I miss a dose? If you miss a dose, take it as soon as you can. If it is almost time for your next dose, take only that dose. Do not take double or extra doses. What may interact with this medicine? Do not take this medicine with any of the following medications: -epoetin alfa This list may not describe all possible interactions. Give your health care provider a list of all the medicines, herbs, non-prescription drugs, or dietary supplements you use. Also tell them if you smoke, drink alcohol, or use illegal drugs. Some items may interact with your medicine. What should I watch for while using this medicine? Visit your prescriber or health care professional for regular checks on your progress and for the needed blood tests and blood pressure measurements. It is especially important for the doctor to make sure your hemoglobin level is in the desired range, to limit the risk of potential side effects and to give you the best benefit. Keep all appointments for any recommended tests. Check your blood pressure as directed. Ask your doctor what your blood pressure should be and when you should contact him or her. As your body makes more red blood cells, you may need to take iron, folic acid, or vitamin B supplements. Ask your doctor or health care provider which products are right for you. If you have kidney disease continue dietary restrictions, even though this medication can make you feel better. Talk with your doctor or health care professional about the   foods you eat and the vitamins that you take. What side effects may I notice from receiving this medicine? Side effects that you should report to your doctor or health care professional as soon as possible: -allergic reactions like skin rash, itching or hives, swelling of the face, lips, or tongue -breathing problems -changes in vision -chest pain -confusion, trouble speaking  or understanding -feeling faint or lightheaded, falls -high blood pressure -muscle aches or pains -pain, swelling, warmth in the leg -rapid weight gain -severe headaches -sudden numbness or weakness of the face, arm or leg -trouble walking, dizziness, loss of balance or coordination -seizures (convulsions) -swelling of the ankles, feet, hands -unusually weak or tired Side effects that usually do not require medical attention (report to your doctor or health care professional if they continue or are bothersome): -diarrhea -fever, chills (flu-like symptoms) -headaches -nausea, vomiting -redness, stinging, or swelling at site where injected This list may not describe all possible side effects. Call your doctor for medical advice about side effects. You may report side effects to FDA at 1-800-FDA-1088. Where should I keep my medicine? Keep out of the reach of children. Store in a refrigerator between 2 and 8 degrees C (36 and 46 degrees F). Do not freeze. Do not shake. Throw away any unused portion if using a single-dose vial. Throw away any unused medicine after the expiration date. NOTE: This sheet is a summary. It may not cover all possible information. If you have questions about this medicine, talk to your doctor, pharmacist, or health care provider.  2013, [employer]. (08/06/2008 10:23:57 AM)  

## 2012-12-17 LAB — COLD AGGLUTININ TITER: Cold Agglutinin Titer: 1:20480 {titer} — AB

## 2013-01-04 ENCOUNTER — Encounter (HOSPITAL_COMMUNITY)
Admission: RE | Admit: 2013-01-04 | Discharge: 2013-01-04 | Disposition: A | Discharge status: Home / Self Care | Payer: PRIVATE HEALTH INSURANCE | Source: Ambulatory Visit | Attending: Hematology & Oncology | Admitting: Hematology & Oncology

## 2013-01-04 DIAGNOSIS — N289 Disorder of kidney and ureter, unspecified: Secondary | ICD-10-CM | POA: Insufficient documentation

## 2013-01-04 DIAGNOSIS — D588 Other specified hereditary hemolytic anemias: Secondary | ICD-10-CM | POA: Insufficient documentation

## 2013-01-04 DIAGNOSIS — D649 Anemia, unspecified: Secondary | ICD-10-CM | POA: Insufficient documentation

## 2013-01-10 ENCOUNTER — Ambulatory Visit (HOSPITAL_BASED_OUTPATIENT_CLINIC_OR_DEPARTMENT_OTHER): Payer: PRIVATE HEALTH INSURANCE | Admitting: Hematology & Oncology

## 2013-01-10 ENCOUNTER — Ambulatory Visit: Payer: PRIVATE HEALTH INSURANCE

## 2013-01-10 ENCOUNTER — Other Ambulatory Visit (HOSPITAL_BASED_OUTPATIENT_CLINIC_OR_DEPARTMENT_OTHER): Payer: PRIVATE HEALTH INSURANCE | Admitting: Lab

## 2013-01-10 VITALS — BP 126/79 | HR 63 | Temp 97.2°F | Resp 16 | Ht 62.0 in

## 2013-01-10 DIAGNOSIS — N289 Disorder of kidney and ureter, unspecified: Secondary | ICD-10-CM

## 2013-01-10 DIAGNOSIS — D649 Anemia, unspecified: Secondary | ICD-10-CM

## 2013-01-10 DIAGNOSIS — D591 Autoimmune hemolytic anemia, unspecified: Secondary | ICD-10-CM

## 2013-01-10 LAB — CBC WITH DIFFERENTIAL (CANCER CENTER ONLY)
BASO#: 0 10*3/uL (ref 0.0–0.2)
HCT: UNDETERMINED % (ref 34.8–46.6)
HGB: 10.5 g/dL — ABNORMAL LOW (ref 11.6–15.9)
LYMPH#: 1.8 10*3/uL (ref 0.9–3.3)
LYMPH%: 35.3 % (ref 14.0–48.0)
MCHC: UNDETERMINED g/dL (ref 32.0–36.0)
MCV: UNDETERMINED fL (ref 81–101)
MONO#: 0.6 10*3/uL (ref 0.1–0.9)
NEUT%: 43.7 % (ref 39.6–80.0)
WBC: 5.2 10*3/uL (ref 3.9–10.0)

## 2013-01-10 NOTE — Progress Notes (Signed)
This office note has been dictated.

## 2013-01-11 NOTE — Progress Notes (Signed)
CC:   Thora Lance, M.D.  DIAGNOSIS: 1. Cold agglutinin hemolytic anemia. 2. Chronic renal insufficiency.  CURRENT THERAPY: 1. Folic acid 2 mg p.o. daily. 2. Aranesp 300 mcg subcu as needed for a hemoglobin less than 10.  INTERIM HISTORY:  Ms. Mcclusky comes in for a followup.  She is very tired today.  She is living with her daughter.  They have a home health aide who is with her 8 hours a day.  Ms. Fiske really does not do all that much.  She sleeps most of the time, according to the aide.  She has some slight discomfort in the right thigh.  There has been no obvious fall. There has been no swelling.  There has been no weakness.  Of note, her last Aranesp was given back in April of 2014.  She has had a decent appetite.  I really do not think she eats all that much.  She is 79 years old, and I can understand why she does not eat all that much.  Her last cold agglutinin titer back in April was 1:20,480.  PHYSICAL EXAMINATION:  General:  This is an elderly, somewhat debilitated African American female in no obvious distress.  She tries to answer questions appropriately.  Again, she is quite lethargic. Vital signs:  Temperature of 97.2, pulse 63, respiratory rate 16, blood pressure 126/79.  Weight was not taken.  Head/neck:  No ocular or oral lesions.  There is no scleral icterus.  There is no adenopathy in the neck.  Lungs:  Clear bilaterally.  She has some slight decrease at the bases.  Cardiac:  Regular rate and rhythm with an occasional extra beat. She has no murmurs, rubs or bruits.  Abdomen:  Soft.  There is no tenderness to palpation over the right thigh.  She has a good range motion of the right hip.  She has no swelling in the legs.  She has warmness to touch to her legs with her skin temperature.  She does have some decent pulses in her distal extremities.  Neurological:  No focal neurological deficits.  Again, she has some lethargy.  LABORATORY STUDIES:  White  cell count 5.2, hemoglobin 10.5, platelet count 147.  IMPRESSION:  Ms. Randal is a 79 year old African American female with cold agglutinin disease.  Her blood counts fluctuate with the weather and the seasons.  Now that it is becoming warmer, I anticipate her hemoglobin to improve.  As such, it is improving.  She does not need any Aranesp today.  Folic acid, I think, is going to be best thing for her.  I told her aide to make sure that she does not get dehydrated.  We will go ahead and plan to get Ms. Riddell back to see [place] in another couple of months.    ______________________________ Josph Macho, M.D. PRE/MEDQ  D:  01/10/2013  T:  01/11/2013  Job:  [id number]

## 2013-01-14 LAB — COLD AGGLUTININ TITER: Cold Agglutinin Titer: 1:20480 {titer} — AB

## 2013-01-25 ENCOUNTER — Telehealth: Payer: Self-pay | Admitting: Hematology & Oncology

## 2013-01-25 NOTE — Telephone Encounter (Signed)
Pt aware of 6-4 and 7-8 appointments

## 2013-02-06 ENCOUNTER — Encounter (HOSPITAL_COMMUNITY)
Admission: RE | Admit: 2013-02-06 | Discharge: 2013-02-06 | Disposition: A | Discharge status: Home / Self Care | Payer: PRIVATE HEALTH INSURANCE | Source: Ambulatory Visit | Attending: Hematology & Oncology | Admitting: Hematology & Oncology

## 2013-02-06 ENCOUNTER — Telehealth: Payer: Self-pay | Admitting: Hematology & Oncology

## 2013-02-06 DIAGNOSIS — N289 Disorder of kidney and ureter, unspecified: Secondary | ICD-10-CM | POA: Insufficient documentation

## 2013-02-06 DIAGNOSIS — D588 Other specified hereditary hemolytic anemias: Secondary | ICD-10-CM | POA: Insufficient documentation

## 2013-02-06 DIAGNOSIS — D649 Anemia, unspecified: Secondary | ICD-10-CM | POA: Insufficient documentation

## 2013-02-06 NOTE — Telephone Encounter (Signed)
daughter moved 6-4 to 6-6.

## 2013-02-07 ENCOUNTER — Ambulatory Visit: Payer: PRIVATE HEALTH INSURANCE

## 2013-02-07 ENCOUNTER — Other Ambulatory Visit: Payer: PRIVATE HEALTH INSURANCE | Admitting: Lab

## 2013-02-08 ENCOUNTER — Other Ambulatory Visit: Payer: Self-pay | Admitting: Medical

## 2013-02-08 DIAGNOSIS — D649 Anemia, unspecified: Secondary | ICD-10-CM

## 2013-02-09 ENCOUNTER — Ambulatory Visit: Payer: PRIVATE HEALTH INSURANCE

## 2013-02-09 ENCOUNTER — Ambulatory Visit (HOSPITAL_BASED_OUTPATIENT_CLINIC_OR_DEPARTMENT_OTHER): Payer: PRIVATE HEALTH INSURANCE | Admitting: Lab

## 2013-02-09 DIAGNOSIS — D649 Anemia, unspecified: Secondary | ICD-10-CM

## 2013-02-09 DIAGNOSIS — N289 Disorder of kidney and ureter, unspecified: Secondary | ICD-10-CM

## 2013-02-09 LAB — CBC WITH DIFFERENTIAL (CANCER CENTER ONLY)
BASO#: 0 10*3/uL (ref 0.0–0.2)
Eosinophils Absolute: 0.3 10*3/uL (ref 0.0–0.5)
HCT: UNDETERMINED % (ref 34.8–46.6)
HGB: 10.3 g/dL — ABNORMAL LOW (ref 11.6–15.9)
LYMPH#: 1.4 10*3/uL (ref 0.9–3.3)
MONO#: 0.5 10*3/uL (ref 0.1–0.9)
NEUT%: 59.4 % (ref 39.6–80.0)
WBC: 5.7 10*3/uL (ref 3.9–10.0)

## 2013-02-09 NOTE — Progress Notes (Unsigned)
HGB 10.3 per Dr. Myna Hidalgo no Aranesp needed. Pt and caregiver verbalized understanding.

## 2013-03-06 ENCOUNTER — Encounter (HOSPITAL_COMMUNITY)
Admission: RE | Admit: 2013-03-06 | Discharge: 2013-03-06 | Disposition: A | Discharge status: Home / Self Care | Payer: PRIVATE HEALTH INSURANCE | Source: Ambulatory Visit | Attending: Hematology & Oncology | Admitting: Hematology & Oncology

## 2013-03-06 DIAGNOSIS — D588 Other specified hereditary hemolytic anemias: Secondary | ICD-10-CM | POA: Insufficient documentation

## 2013-03-06 DIAGNOSIS — D649 Anemia, unspecified: Secondary | ICD-10-CM | POA: Insufficient documentation

## 2013-03-06 DIAGNOSIS — N289 Disorder of kidney and ureter, unspecified: Secondary | ICD-10-CM | POA: Insufficient documentation

## 2013-03-13 ENCOUNTER — Other Ambulatory Visit: Payer: PRIVATE HEALTH INSURANCE | Admitting: Lab

## 2013-03-13 ENCOUNTER — Ambulatory Visit: Payer: PRIVATE HEALTH INSURANCE

## 2013-03-13 ENCOUNTER — Ambulatory Visit: Payer: PRIVATE HEALTH INSURANCE | Admitting: Hematology & Oncology

## 2013-03-14 ENCOUNTER — Ambulatory Visit: Payer: PRIVATE HEALTH INSURANCE

## 2013-03-14 ENCOUNTER — Encounter: Payer: Self-pay | Admitting: Hematology & Oncology

## 2013-03-14 ENCOUNTER — Ambulatory Visit (HOSPITAL_BASED_OUTPATIENT_CLINIC_OR_DEPARTMENT_OTHER): Payer: PRIVATE HEALTH INSURANCE | Admitting: Hematology & Oncology

## 2013-03-14 ENCOUNTER — Ambulatory Visit (HOSPITAL_BASED_OUTPATIENT_CLINIC_OR_DEPARTMENT_OTHER): Payer: PRIVATE HEALTH INSURANCE | Admitting: Lab

## 2013-03-14 VITALS — BP 141/65 | HR 61 | Temp 97.9°F | Resp 16 | Ht 62.0 in

## 2013-03-14 DIAGNOSIS — D5912 Cold autoimmune hemolytic anemia: Secondary | ICD-10-CM | POA: Insufficient documentation

## 2013-03-14 DIAGNOSIS — D649 Anemia, unspecified: Secondary | ICD-10-CM

## 2013-03-14 HISTORY — DX: Cold autoimmune hemolytic anemia: D59.12

## 2013-03-14 LAB — CBC WITH DIFFERENTIAL (CANCER CENTER ONLY)
BASO#: 0 10*3/uL (ref 0.0–0.2)
Eosinophils Absolute: 0.2 10*3/uL (ref 0.0–0.5)
HCT: UNDETERMINED % (ref 34.8–46.6)
HGB: 10.7 g/dL — ABNORMAL LOW (ref 11.6–15.9)
LYMPH%: 28.1 % (ref 14.0–48.0)
MCH: UNDETERMINED pg (ref 26.0–34.0)
MCV: UNDETERMINED fL (ref 81–101)
MONO%: 9.9 % (ref 0.0–13.0)
NEUT#: 4.2 10*3/uL (ref 1.5–6.5)
RBC: UNDETERMINED 10*6/uL (ref 3.70–5.32)

## 2013-03-14 NOTE — Progress Notes (Signed)
This office note has been dictated.

## 2013-03-15 NOTE — Progress Notes (Signed)
CC:   Thora Lance, M.D.  DIAGNOSIS: 1. Cold agglutinin hemolytic anemia. 2. Chronic renal insufficiency.  CURRENT THERAPY: 1. Folic acid 2 mg p.o. daily. 2. Aranesp 300 mcg subcu as needed for hemoglobin less than 10.  INTERIM HISTORY:  Ms. [name] comes in for a followup.  She is 79 years old.  She is mostly in a wheelchair.  She really does not do much at all at home.  However, she is enjoying her life.  Thankfully, she has not required any Aranesp since April.  During the summer months, her blood count gets better because it is warmer outside.  She has had no problems with fever.  She has had no bleeding.  She has had some leg swelling, again which is chronic.  PHYSICAL EXAMINATION:  General:  This is an elderly black female in no obvious distress.  Vital Signs:  Temperature of 97.9, pulse 61, respiratory rate 16, blood pressure 141/65.  Weight is not taken.  Head and Neck:  Normocephalic, atraumatic skull.  There are no ocular or oral lesions.  There are no palpable cervical or supraclavicular lymph nodes. Lungs:  Clear bilaterally.  Cardiac:  Regular rate and rhythm with no murmurs, rubs or bruits.  Abdomen:  Soft.  She has good bowel sounds. There is no palpable hepatosplenomegaly.  Extremities:  Some trace edema in her legs.  She has osteoarthritic changes in her joints.  LABORATORIES STUDIES:  White cell count 7.2, hemoglobin 10.7, platelet count 158,000.  IMPRESSION:  Mr. Cura is a very charming 79 year old African American female with cold agglutinin hemolytic anemia.  Back in May, her cold agglutinin titer was 1:20480.  I am impressed that she has not required any Aranesp for 3 months.  She does not need any today.  I think we can probably get her through until after Labor Day now.  It can be a struggle to get her out here.    ______________________________ Josph Macho, M.D. PRE/MEDQ  D:  03/14/2013  T:  03/15/2013  Job:  [id number]

## 2013-04-06 ENCOUNTER — Encounter (HOSPITAL_COMMUNITY)
Admission: RE | Admit: 2013-04-06 | Discharge: 2013-04-06 | Disposition: A | Discharge status: Home / Self Care | Payer: PRIVATE HEALTH INSURANCE | Source: Ambulatory Visit | Attending: Hematology & Oncology | Admitting: Hematology & Oncology

## 2013-04-06 DIAGNOSIS — N289 Disorder of kidney and ureter, unspecified: Secondary | ICD-10-CM | POA: Insufficient documentation

## 2013-04-06 DIAGNOSIS — D649 Anemia, unspecified: Secondary | ICD-10-CM | POA: Insufficient documentation

## 2013-04-06 DIAGNOSIS — D588 Other specified hereditary hemolytic anemias: Secondary | ICD-10-CM | POA: Insufficient documentation

## 2013-04-17 ENCOUNTER — Encounter (HOSPITAL_COMMUNITY): Payer: Self-pay | Admitting: *Deleted

## 2013-04-17 ENCOUNTER — Emergency Department (HOSPITAL_COMMUNITY): Payer: PRIVATE HEALTH INSURANCE

## 2013-04-17 ENCOUNTER — Inpatient Hospital Stay (HOSPITAL_COMMUNITY)
Admission: EM | Admit: 2013-04-17 | Discharge: 2013-05-07 | DRG: 682 | Disposition: E | Payer: PRIVATE HEALTH INSURANCE | Attending: Internal Medicine | Admitting: Internal Medicine

## 2013-04-17 ENCOUNTER — Inpatient Hospital Stay (HOSPITAL_COMMUNITY): Payer: PRIVATE HEALTH INSURANCE

## 2013-04-17 ENCOUNTER — Other Ambulatory Visit: Payer: Self-pay

## 2013-04-17 DIAGNOSIS — L89109 Pressure ulcer of unspecified part of back, unspecified stage: Secondary | ICD-10-CM | POA: Diagnosis present

## 2013-04-17 DIAGNOSIS — E860 Dehydration: Secondary | ICD-10-CM | POA: Diagnosis present

## 2013-04-17 DIAGNOSIS — Z8673 Personal history of transient ischemic attack (TIA), and cerebral infarction without residual deficits: Secondary | ICD-10-CM

## 2013-04-17 DIAGNOSIS — I509 Heart failure, unspecified: Secondary | ICD-10-CM | POA: Diagnosis present

## 2013-04-17 DIAGNOSIS — H409 Unspecified glaucoma: Secondary | ICD-10-CM | POA: Diagnosis present

## 2013-04-17 DIAGNOSIS — D696 Thrombocytopenia, unspecified: Secondary | ICD-10-CM | POA: Diagnosis present

## 2013-04-17 DIAGNOSIS — G9340 Encephalopathy, unspecified: Secondary | ICD-10-CM | POA: Diagnosis present

## 2013-04-17 DIAGNOSIS — R222 Localized swelling, mass and lump, trunk: Secondary | ICD-10-CM | POA: Diagnosis present

## 2013-04-17 DIAGNOSIS — N179 Acute kidney failure, unspecified: Secondary | ICD-10-CM | POA: Diagnosis present

## 2013-04-17 DIAGNOSIS — D649 Anemia, unspecified: Secondary | ICD-10-CM | POA: Diagnosis present

## 2013-04-17 DIAGNOSIS — L8990 Pressure ulcer of unspecified site, unspecified stage: Secondary | ICD-10-CM | POA: Diagnosis present

## 2013-04-17 DIAGNOSIS — R4182 Altered mental status, unspecified: Secondary | ICD-10-CM

## 2013-04-17 DIAGNOSIS — F0390 Unspecified dementia without behavioral disturbance: Secondary | ICD-10-CM | POA: Insufficient documentation

## 2013-04-17 DIAGNOSIS — R627 Adult failure to thrive: Secondary | ICD-10-CM | POA: Diagnosis present

## 2013-04-17 DIAGNOSIS — L89209 Pressure ulcer of unspecified hip, unspecified stage: Secondary | ICD-10-CM | POA: Diagnosis present

## 2013-04-17 DIAGNOSIS — M2450 Contracture, unspecified joint: Secondary | ICD-10-CM | POA: Diagnosis present

## 2013-04-17 DIAGNOSIS — I959 Hypotension, unspecified: Secondary | ICD-10-CM | POA: Diagnosis present

## 2013-04-17 DIAGNOSIS — Z950 Presence of cardiac pacemaker: Secondary | ICD-10-CM

## 2013-04-17 DIAGNOSIS — IMO0002 Reserved for concepts with insufficient information to code with codable children: Secondary | ICD-10-CM | POA: Diagnosis present

## 2013-04-17 DIAGNOSIS — D591 Autoimmune hemolytic anemia, unspecified: Secondary | ICD-10-CM | POA: Diagnosis present

## 2013-04-17 DIAGNOSIS — Z7401 Bed confinement status: Secondary | ICD-10-CM

## 2013-04-17 DIAGNOSIS — L8992 Pressure ulcer of unspecified site, stage 2: Secondary | ICD-10-CM | POA: Diagnosis present

## 2013-04-17 DIAGNOSIS — M1990 Unspecified osteoarthritis, unspecified site: Secondary | ICD-10-CM | POA: Diagnosis present

## 2013-04-17 DIAGNOSIS — Z66 Do not resuscitate: Secondary | ICD-10-CM | POA: Diagnosis present

## 2013-04-17 DIAGNOSIS — N390 Urinary tract infection, site not specified: Secondary | ICD-10-CM | POA: Diagnosis present

## 2013-04-17 DIAGNOSIS — E875 Hyperkalemia: Secondary | ICD-10-CM

## 2013-04-17 DIAGNOSIS — E538 Deficiency of other specified B group vitamins: Secondary | ICD-10-CM | POA: Diagnosis present

## 2013-04-17 DIAGNOSIS — E43 Unspecified severe protein-calorie malnutrition: Secondary | ICD-10-CM | POA: Diagnosis present

## 2013-04-17 HISTORY — DX: Unspecified dementia, unspecified severity, without behavioral disturbance, psychotic disturbance, mood disturbance, and anxiety: F03.90

## 2013-04-17 LAB — URINALYSIS W MICROSCOPIC + REFLEX CULTURE
Nitrite: POSITIVE — AB
Specific Gravity, Urine: 1.023 (ref 1.005–1.030)
Urobilinogen, UA: 1 mg/dL (ref 0.0–1.0)

## 2013-04-17 LAB — CBC WITH DIFFERENTIAL/PLATELET
Basophils Relative: 0 % (ref 0–1)
Eosinophils Relative: 1 % (ref 0–5)
Hemoglobin: 8.7 g/dL — ABNORMAL LOW (ref 12.0–15.0)
Lymphs Abs: 1.9 10*3/uL (ref 0.7–4.0)
MCH: 37.3 pg — ABNORMAL HIGH (ref 26.0–34.0)
MCV: 106.9 fL — ABNORMAL HIGH (ref 78.0–100.0)
Monocytes Absolute: 1.1 10*3/uL — ABNORMAL HIGH (ref 0.1–1.0)
Neutro Abs: 11.2 10*3/uL — ABNORMAL HIGH (ref 1.7–7.7)
RBC: 2.33 MIL/uL — ABNORMAL LOW (ref 3.87–5.11)

## 2013-04-17 LAB — GLUCOSE, CAPILLARY

## 2013-04-17 LAB — BASIC METABOLIC PANEL
CO2: 22 mEq/L (ref 19–32)
Calcium: 8.7 mg/dL (ref 8.4–10.5)
Glucose, Bld: 125 mg/dL — ABNORMAL HIGH (ref 70–99)
Potassium: >7.5 mEq/L (ref 3.5–5.1)
Sodium: 134 mEq/L — ABNORMAL LOW (ref 135–145)

## 2013-04-17 LAB — LACTIC ACID, PLASMA: Lactic Acid, Venous: 2.6 mmol/L — ABNORMAL HIGH (ref 0.5–2.2)

## 2013-04-17 MED ORDER — ONDANSETRON HCL 4 MG/2ML IJ SOLN: 4.0000 mg | Freq: Four times a day (QID) | INTRAMUSCULAR | Status: DC | PRN

## 2013-04-17 MED ORDER — ACETAMINOPHEN 325 MG PO TABS: 650.0000 mg | ORAL_TABLET | Freq: Four times a day (QID) | ORAL | Status: DC | PRN

## 2013-04-17 MED ORDER — SODIUM CHLORIDE 0.9 % IV SOLN
INTRAVENOUS | Status: DC
  Administered 2013-04-17: 75 mL/h via INTRAVENOUS
  Administered 2013-04-17 – 2013-04-19 (×4): via INTRAVENOUS

## 2013-04-17 MED ORDER — SODIUM CHLORIDE 0.9 % IV BOLUS (SEPSIS)
500.0000 mL | Freq: Once | INTRAVENOUS | Status: AC
  Administered 2013-04-17: 500 mL via INTRAVENOUS

## 2013-04-17 MED ORDER — TIMOLOL MALEATE 0.5 % OP SOLN
1.0000 [drp] | Freq: Every day | OPHTHALMIC | Status: DC
  Administered 2013-04-17 – 2013-04-19 (×2): 1 [drp] via OPHTHALMIC
  Filled 2013-04-17 (×2): qty 5

## 2013-04-17 MED ORDER — ADULT MULTIVITAMIN W/MINERALS CH
1.0000 | ORAL_TABLET | Freq: Every day | ORAL | Status: DC
  Filled 2013-04-17 (×2): qty 1

## 2013-04-17 MED ORDER — SODIUM CHLORIDE 0.9 % IJ SOLN
3.0000 mL | Freq: Two times a day (BID) | INTRAMUSCULAR | Status: DC
  Administered 2013-04-19: 3 mL via INTRAVENOUS

## 2013-04-17 MED ORDER — FOLIC ACID 1 MG PO TABS
2.0000 mg | ORAL_TABLET | Freq: Every day | ORAL | Status: DC
  Filled 2013-04-17 (×2): qty 2

## 2013-04-17 MED ORDER — DEXTROSE 5 % IV SOLN
1.0000 g | INTRAVENOUS | Status: DC
  Administered 2013-04-17: 1 g via INTRAVENOUS
  Filled 2013-04-17: qty 10

## 2013-04-17 MED ORDER — ACETAMINOPHEN 650 MG RE SUPP
650.0000 mg | Freq: Four times a day (QID) | RECTAL | Status: DC | PRN
  Administered 2013-04-19 (×3): 650 mg via RECTAL
  Filled 2013-04-17 (×3): qty 1

## 2013-04-17 MED ORDER — ONDANSETRON HCL 4 MG PO TABS: 4.0000 mg | ORAL_TABLET | Freq: Four times a day (QID) | ORAL | Status: DC | PRN

## 2013-04-17 MED ORDER — INSULIN ASPART 100 UNIT/ML IV SOLN
5.0000 [IU] | Freq: Once | INTRAVENOUS | Status: AC
  Administered 2013-04-17: 5 [IU] via INTRAVENOUS
  Filled 2013-04-17: qty 0.05

## 2013-04-17 MED ORDER — DEXTROSE 50 % IV SOLN
25.0000 g | Freq: Once | INTRAVENOUS | Status: AC
  Administered 2013-04-17: 25 g via INTRAVENOUS
  Filled 2013-04-17: qty 50

## 2013-04-17 MED ORDER — ALBUTEROL SULFATE (5 MG/ML) 0.5% IN NEBU: 2.5000 mg | INHALATION_SOLUTION | RESPIRATORY_TRACT | Status: DC | PRN

## 2013-04-17 MED ORDER — SODIUM CHLORIDE 0.9 % IV BOLUS (SEPSIS)
250.0000 mL | Freq: Once | INTRAVENOUS | Status: AC
  Administered 2013-04-17: 250 mL via INTRAVENOUS

## 2013-04-17 MED ORDER — SODIUM POLYSTYRENE SULFONATE 15 GM/60ML PO SUSP
15.0000 g | Freq: Once | ORAL | Status: AC
  Administered 2013-04-18: 15 g via RECTAL
  Filled 2013-04-17: qty 60

## 2013-04-17 NOTE — ED Notes (Signed)
CRITICAL VALUE ALERT  Critical value received:  Potassium 7.5 hemolyzed  Date of notification:  05/05/2013  Time of notification:  1650  Critical value read back:yes  Nurse who received alert:  MG Jenelle Mages, RN   MD notified (1st page):  Dr. Gwendolyn Grant  Time of first page:  1650  MD notified (2nd page):  Time of second page:  Responding MD:  Dr. Gwendolyn Grant  Time MD responded: [phone]

## 2013-04-17 NOTE — ED Notes (Signed)
CRITICAL VALUE ALERT  Critical value received:  Potassium 6.6 Hemolyzed  Date of notification:  04/26/2013  Time of notification:  1820  Critical value read back:yes  Nurse who received alert:  MG Jenelle Mages, RN  MD notified (1st page):  Dr. Gwendolyn Grant   Time of first page:  1820  MD notified (2nd page):  Time of second page:  Responding MD:  Dr. Gwendolyn Grant  Time MD responded:  [phone]

## 2013-04-17 NOTE — ED Notes (Signed)
PT has become increasing lethargic, not eating, decreased urine output, and breathing pattern irregular.  Pt appears weak and pale.  Pt HOH.  Could not get temp at triage

## 2013-04-17 NOTE — ED Notes (Signed)
MD Gwendolyn Grant made aware of pts critical hemolyzed potassium >7.5.

## 2013-04-17 NOTE — ED Notes (Signed)
Patient still has a left hip wound and right hip wound, necrotic tissue with yellow exudate.  10X10.  Patient's family says she has had them for a while.

## 2013-04-17 NOTE — ED Notes (Signed)
Patient transported to X-ray 

## 2013-04-17 NOTE — ED Provider Notes (Signed)
CSN: [id number]     Arrival date & time 04/08/2013  1315 History     First MD Initiated Contact with Patient 04/11/2013 1356     Chief Complaint  Patient presents with  . Altered Mental Status    Patient is a 79 y.o. female presenting with altered mental status. The history is provided by a caregiver and a relative. The history is limited by the condition of the patient (Hx dementia).  Altered Mental Status Pt was seen at 1415. Per pt's family, c/o gradual onset and worsening of persistent generalized weakness for the past 4 days. Pt's family states she has not eaten or drank "anything" for the past 4 days. States pt has had increasing lethargy and urine output also. Pt has significant hx of dementia, but can "usually talk with you a bit" per family. Pt also is wheelchair and bed-bound at baseline. Denies N/V/D, no SOB/cough, no falls, no syncope/LOC, no focal motor weakness.    Past Medical History  Diagnosis Date  . Bradycardia     S/p Pacemaker  . Cold agglutinin disease   . Anemia   . DJD (degenerative joint disease)   . Hx of fracture of right hip   . UTI (lower urinary tract infection)   . Glaucoma   . History of CVA (cerebrovascular accident)   . Vitamin B12 deficiency   . 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 Dementia    History reviewed. No pertinent past surgical history.  History  Substance Use Topics  . Smoking status: Never Smoker   . Smokeless tobacco: Not on file  . Alcohol Use: No    Review of Systems  Unable to perform ROS: Dementia  Psychiatric/Behavioral: Positive for altered mental status.    Allergies  Review of patient's allergies indicates no known allergies.  Home Medications   Current Outpatient Rx  Name  Route  Sig  Dispense  Refill  . acetaminophen (TYLENOL) 325 MG tablet   Oral   Take 650 mg by mouth every 6 (six) hours as needed for pain (for pain).          . cyanocobalamin 1000 MCG tablet   Intramuscular   Inject 100 mcg into the  muscle every 30 (thirty) days.           . folic acid (FOLVITE) 1 MG tablet   Oral   Take 2 mg by mouth daily.           . Multiple Vitamin (MULTIVITAMIN) tablet   Oral   Take 1 tablet by mouth daily.           Marland Kitchen oxyCODONE-acetaminophen (PERCOCET/ROXICET) 5-325 MG per tablet   Oral   Take 1 tablet by mouth every 8 (eight) hours as needed for pain.          Marland Kitchen timolol (TIMOPTIC) 0.5 % ophthalmic solution   Both Eyes   Place 1 drop into both eyes daily.           BP 94/52  Pulse 64  Temp(Src) 99.2 F (37.3 C) (Rectal)  Resp 16  Wt 121 lb 5 oz (55.027 kg)  BMI 22.18 kg/m2  SpO2 98% Filed Vitals:   04/28/2013 1419 04/27/2013 1419  1419 04/16/2013 1628  BP:  83/49 79/46 94/52   Pulse:  70 67 64  Temp: 99.2 F (37.3 C)     TempSrc: Rectal     Resp:    16  Weight:      SpO2:    98%  Physical Exam 1420: Physical examination:  Nursing notes reviewed; Vital signs and O2 SAT reviewed;  Constitutional: Thin, frail, In no acute distress; Head:  Normocephalic, atraumatic; Eyes: EOMI, PERRL, No scleral icterus; ENMT: Mouth and pharynx normal, Mucous membranes dry; Neck: Supple, Full range of motion, No lymphadenopathy; Cardiovascular: Regular rate and rhythm, No gallop; Respiratory: Breath sounds clear & equal bilaterally, No wheezes.  Speaking full sentences with ease, Normal respiratory effort/excursion; Chest: Nontender, Movement normal; Abdomen: Soft, Nontender, Nondistended, Normal bowel sounds; Genitourinary: No CVA tenderness; Extremities: Pulses normal, No tenderness, No edema, No calf edema or asymmetry.; Neuro: Awake, alert. Nods head to questions. Moans. Moves extremities spontaneously.; Skin: Color normal, Warm, Dry.   ED Course     Procedures     MDM  MDM Reviewed: previous chart, nursing note and vitals Reviewed previous: labs and ECG Interpretation: labs, ECG, CT scan and x-ray    Date: April 22, 2013  Rate: 67  Rhythm: Electronic pacemaker  QRS  Axis: left  Intervals: normal  ST/T Wave abnormalities: normal  Conduction Disutrbances:none  Narrative Interpretation:   Old EKG Reviewed: changes noted; previous EKG dated 12/06/2012 was afib.  Results for orders placed during the hospital encounter of 04/22/13  URINALYSIS W MICROSCOPIC + REFLEX CULTURE      Result Value Range   Color, Urine RED (*) YELLOW   APPearance CLOUDY (*) CLEAR   Specific Gravity, Urine 1.023  1.005 - 1.030   pH 5.0  5.0 - 8.0   Glucose, UA NEGATIVE  NEGATIVE mg/dL   Hgb urine dipstick SMALL (*) NEGATIVE   Bilirubin Urine MODERATE (*) NEGATIVE   Ketones, ur 15 (*) NEGATIVE mg/dL   Protein, ur 30 (*) NEGATIVE mg/dL   Urobilinogen, UA 1.0  0.0 - 1.0 mg/dL   Nitrite POSITIVE (*) NEGATIVE   Leukocytes, UA SMALL (*) NEGATIVE   WBC, UA 0-2  <3 WBC/hpf   RBC / HPF 0-2  <3 RBC/hpf   Bacteria, UA RARE  RARE   Casts HYALINE CASTS (*) NEGATIVE   Urine-Other AMORPHOUS URATES/PHOSPHATES    GLUCOSE, CAPILLARY      Result Value Range   Glucose-Capillary 128 (*) 70 - 99 mg/dL   Comment 1 Documented in Chart     Comment 2 Notify RN     Dg Chest 2 View 04-22-13   *RADIOLOGY REPORT*  Clinical Data: Acute mental status changes.  CHEST - 2 VIEW  Comparison: Portable chest x-rays 06/11/2010 dating back to 05/31/2010.  Two-view chest x-ray 10/24/2008.  Findings: Cardiac silhouette moderately to markedly enlarged but stable.  Thoracic aorta tortuous and atherosclerotic, unchanged. Enlarged central right pulmonary artery, increased in size since 2010.  Pulmonary venous hypertension and minimal interstitial pulmonary edema.  Bilateral pleural effusions, right greater than left, and associated consolidation in the lower lobes.  Left subclavian dual lead transvenous pacemaker unchanged and appears intact.  IMPRESSION: Minimal to mild CHF, with stable cardiomegaly and mild diffuse interstitial pulmonary edema.  Bilateral pleural effusions, right greater than left, with associated  passive atelectasis and/or pneumonia in the lower lobes.   Original Report Authenticated By: Hulan Saas, M.D.   Ct Head Wo Contrast April 22, 2013   *RADIOLOGY REPORT*  Clinical Data: Altered mental status.  CT HEAD WITHOUT CONTRAST  Technique:  Contiguous axial images were obtained from the base of the skull through the vertex without contrast.  Comparison: 12/06/2012.  Findings: No intracranial hemorrhage.  Small vessel disease type changes without CT evidence of large acute infarct.  Artifact extends through the  pons and medulla.  Global atrophy without change.  No intracranial mass lesion detected on this unenhanced exam.  Prominent vascular calcifications.  Mucosal thickening in the right maxillary sinus.  Orbital structures unremarkable.  IMPRESSION: No intracranial hemorrhage.  Small vessel disease type changes without CT evidence of large acute infarct.  Global atrophy without change.  Prominent vascular calcifications.  Mucosal thickening in the right maxillary sinus.   Original Report Authenticated By: Lacy Duverney, M.D.    1630:  Pt's labs pending. Orthostatic from lay to sit, SBP increased after judicious IVF bolus. Appears clinically dehydrated. Will need admit. Sign out to Dr. Gwendolyn Grant.      Laray Anger, DO 05/06/2013 [phone]

## 2013-04-17 NOTE — H&P (Addendum)
[facility] History and Physical  [name] ZOX:[id number] DOB: 06-16-1915 DOA: 09-May-2013  Referring physician: EDP PCP: Lillia Mountain, MD  Specialists:  1. Cardiology: Dr. Verdis Prime. 2. Hematology: Dr. Arlan Organ.  Chief Complaint: Weakness, poor oral intake.  HPI: [name] is a 79 y.o. female with history of dementia, mostly bedbound, pacemaker, cold agglutinin disease, anemia, blood transfusions, right hip fracture, UTI, CVA, B12 deficiency, sent to the ED from PCPs office secondary to poor oral intake, decreased urine output, generalized weakness, altered mental status. History is obtained from patient's 2 daughters at bedside. At baseline, she states mostly in bed and for a couple of hours will be on wheelchair, able to recognize her family members. For the last 5-6 days, the family has noted progressive decline-decreased oral intake, decreased urine output, generalized weakness and excessive sleepiness. No fever, chills, nausea, vomiting, diarrhea, pain, cough or dyspnea. She was seen by her PCP and started on diuretics for leg edema. There was no change in her urine output. Her status continued to decline. She was taken back to the PCPs office today and was sent to the ED for possible dehydration, renal insufficiency and urinary tract infection. In the ED, hypotensive with systolic blood pressure in the 90s, CT head does not show anything acute, UA shows positive nitrites but only 0-2 white blood cells, potassium is 6.6 without EKG changes and creatinine is 2. Hospitalist admission requested.   Review of Systems: all systems reviewed with family and apart from history of presenting illness, negative. family denies history of trauma or fall   Past Medical History  Diagnosis Date  . Bradycardia     S/p Pacemaker  . Cold agglutinin disease   . Anemia   . DJD (degenerative joint disease)   . Hx of fracture of right hip   . UTI (lower urinary tract  infection)   . Glaucoma   . History of CVA (cerebrovascular accident)   . Vitamin B12 deficiency   . 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 Dementia    History reviewed. No pertinent past surgical history. Social History:  reports that she has never smoked. She does not have any smokeless tobacco history on file. She reports that she does not drink alcohol or use illicit drugs.  widowed. Lives with her daughter.   No Known Allergies  No family history on file.  negative family history.   Prior to Admission medications   Medication Sig Start Date End Date Taking? Authorizing Provider  acetaminophen (TYLENOL) 325 MG tablet Take 650 mg by mouth every 6 (six) hours as needed for pain (for pain).    Yes Historical Provider, MD  cyanocobalamin 1000 MCG tablet Inject 100 mcg into the muscle every 30 (thirty) days.     Yes Historical Provider, MD  folic acid (FOLVITE) 1 MG tablet Take 2 mg by mouth daily.     Yes Historical Provider, MD  Multiple Vitamin (MULTIVITAMIN) tablet Take 1 tablet by mouth daily.     Yes Historical Provider, MD  oxyCODONE-acetaminophen (PERCOCET/ROXICET) 5-325 MG per tablet Take 1 tablet by mouth every 8 (eight) hours as needed for pain.  12/20/12  Yes Historical Provider, MD  timolol (TIMOPTIC) 0.5 % ophthalmic solution Place 1 drop into both eyes daily.  03/11/12  Yes Historical Provider, MD   Physical Exam: Filed Vitals:   2013/05/09 1628 05/09/13 1645 09-May-2013 1700 2013/05/09 1800  BP: 94/52  96/48 91/47  Pulse: 64 108    Temp:  TempSrc:      Resp: 16 22 21 16   Weight:      SpO2: 98% 100%       General exam:  small built and cachectic, chronically ill-looking female lying comfortably supine on the gurney.   Head, eyes and ENT: Nontraumatic and normocephalic. Pupils equally reacting to light and accommodation. Oral mucosa dry .  Neck: Supple. No JVD, carotid bruit or thyromegaly.  Lymphatics: No lymphadenopathy.  Respiratory system:  poor inspiratory  effort-? Chynes strokes breathing . Reduced breath sounds in the bases but otherwise seems clear without crackles, rhonchi or wheezing. No increased work of breathing.  Cardiovascular system: S1 and S2 heard, RRR. No JVD, murmurs, gallops, clicks or pedal edema.  Gastrointestinal system: Abdomen is nondistended, soft and nontender. Normal bowel sounds heard. No organomegaly or masses appreciated.  Central nervous system:  somnolent but opens eyes, nonverbal and does not obey commands. No focal neurological deficits.   Extremities:  spontaneously moves all limbs upper >lower extremities . Peripheral pulses symmetrically felt.  Skin:  patient has stage II decubitus ulcers bilateral hips right greater than left and? On sacrum. These are clean without acute findings.   Musculoskeletal system:  patient has approximately 15 x 8 cm oval soft, nonpulsatile, nontender swelling over the right mid lateral back with some superficial bruising-apparently new   Psychiatry:  cannot be assessed.    Labs on Admission:  Basic Metabolic Panel:  Recent Labs Lab April 26, 2013 1359 April 26, 2013 1730  NA 134*  --   K >7.5* 6.6*  CL 102  --   CO2 22  --   GLUCOSE 125*  --   BUN 55*  --   CREATININE 2.06*  --   CALCIUM 8.7  --    Liver Function Tests: No results found for this basename: AST, ALT, ALKPHOS, BILITOT, PROT, ALBUMIN,  in the last 168 hours No results found for this basename: LIPASE, AMYLASE,  in the last 168 hours No results found for this basename: AMMONIA,  in the last 168 hours CBC:  Recent Labs Lab 04/26/2013 1359  WBC 14.3*  NEUTROABS 11.2*  HGB 8.7*  HCT 24.9*  MCV 106.9*  PLT 125*   Cardiac Enzymes: No results found for this basename: CKTOTAL, CKMB, CKMBINDEX, TROPONINI,  in the last 168 hours  BNP (last 3 results) No results found for this basename: PROBNP,  in the last 8760 hours CBG:  Recent Labs Lab 04/26/13 1425  GLUCAP 128*    Radiological Exams on Admission: Dg  Chest 2 View  2013/04/26   *RADIOLOGY REPORT*  Clinical Data: Acute mental status changes.  CHEST - 2 VIEW  Comparison: Portable chest x-rays 06/11/2010 dating back to 05/31/2010.  Two-view chest x-ray 10/24/2008.  Findings: Cardiac silhouette moderately to markedly enlarged but stable.  Thoracic aorta tortuous and atherosclerotic, unchanged. Enlarged central right pulmonary artery, increased in size since 2010.  Pulmonary venous hypertension and minimal interstitial pulmonary edema.  Bilateral pleural effusions, right greater than left, and associated consolidation in the lower lobes.  Left subclavian dual lead transvenous pacemaker unchanged and appears intact.  IMPRESSION: Minimal to mild CHF, with stable cardiomegaly and mild diffuse interstitial pulmonary edema.  Bilateral pleural effusions, right greater than left, with associated passive atelectasis and/or pneumonia in the lower lobes.   Original Report Authenticated By: Hulan Saas, M.D.   Ct Head Wo Contrast  April 26, 2013   *RADIOLOGY REPORT*  Clinical Data: Altered mental status.  CT HEAD WITHOUT CONTRAST  Technique:  Contiguous axial  images were obtained from the base of the skull through the vertex without contrast.  Comparison: 12/06/2012.  Findings: No intracranial hemorrhage.  Small vessel disease type changes without CT evidence of large acute infarct.  Artifact extends through the pons and medulla.  Global atrophy without change.  No intracranial mass lesion detected on this unenhanced exam.  Prominent vascular calcifications.  Mucosal thickening in the right maxillary sinus.  Orbital structures unremarkable.  IMPRESSION: No intracranial hemorrhage.  Small vessel disease type changes without CT evidence of large acute infarct.  Global atrophy without change.  Prominent vascular calcifications.  Mucosal thickening in the right maxillary sinus.   Original Report Authenticated By: Lacy Duverney, M.D.    EKG: Independently reviewed.  ventricular  paced rhythm  Assessment/Plan Active Problems:   Encephalopathy acute   Hyperkalemia   UTI (lower urinary tract infection)   Anemia   Dehydration   Acute renal failure   Failure to thrive   Decubitus ulcer- b/l hip & sacrum   Mass on back - right   Thrombocytopenia   Dementia   1. Possible UTI: UA however does not look impressive but family gives history of foul-smelling urine and reduced urine output. We'll repeat urine microscopy and culture. Start IV Rocephin empirically pending culture results. 2. Dehydration: Secondary to poor oral intake and diuretics. Gentle IV fluids. althogh CXR reports pulmonary edema- she is clinically dry. 3. Acute renal failure: Possibly oliguric. IV fluids and follow BMP. 4. Hyperkalemia: Secondary to acute renal failure. Patient not on ACE inhibitors or ARB's. IV fluids, rectal Kayexalate & insulin dextrose. Follow BMP in a few hours. 5. Macrocytic anemia: Acute on chronic with approximately 2 g hemoglobin dropped in the last 2 months.? Secondary to hematoma. Check CBCs closely and transfuse if hemoglobin less than 7 g/dL. 6. Thrombocytopenia: Follow CBC. 7. Right back mass: Rule out hematoma. Obtain non contrasted CT Abdomen & pelvis. Check CBCs frequently and transfuse if hemoglobin less than 7 g/dL. Patient not on blood thinners.  8. Encephalopathy: Acute on chronic secondary to acute illness complicating underlying advanced dementia. CT head negative for acute findings. 9. Failure to thrive:? Consider home hospice. 10. Decubitus ulcers bilateral hip and sacral, present on admission: Clean. Wound care consulted.     Code Status:  DO NOT RESUSCITATE. Confirmed with 2 daughters at bedside.  Family Communication:  discussed with Ms. Baldwin [place] & Ms. Lawernce Ion and daughter-in-law at bedside. Disposition Plan:  home when medically stable.   Time spent:  70 minutes  [facility] Pager [phone]  If 7PM-7AM, please contact  night-coverage [email or web address] Password [facility] 2013-04-26, 6:57 PM

## 2013-04-18 LAB — BLOOD GAS, ARTERIAL
Acid-base deficit: 2.5 mmol/L — ABNORMAL HIGH (ref 0.0–2.0)
TCO2: 21.2 mmol/L (ref 0–100)
pCO2 arterial: 27.3 mmHg — ABNORMAL LOW (ref 35.0–45.0)
pH, Arterial: 7.486 — ABNORMAL HIGH (ref 7.350–7.450)
pO2, Arterial: 65.4 mmHg — ABNORMAL LOW (ref 80.0–100.0)

## 2013-04-18 LAB — CBC
Hemoglobin: 8 g/dL — ABNORMAL LOW (ref 12.0–15.0)
MCH: 57.1 pg — ABNORMAL HIGH (ref 26.0–34.0)
MCHC: 31.3 g/dL (ref 30.0–36.0)
MCV: 112.3 fL — ABNORMAL HIGH (ref 78.0–100.0)
Platelets: 123 10*3/uL — ABNORMAL LOW (ref 150–400)
Platelets: UNDETERMINED 10*3/uL (ref 150–400)
RBC: 1.4 MIL/uL — ABNORMAL LOW (ref 3.87–5.11)
RDW: 21 % — ABNORMAL HIGH (ref 11.5–15.5)
WBC: 12.4 10*3/uL — ABNORMAL HIGH (ref 4.0–10.5)

## 2013-04-18 LAB — BASIC METABOLIC PANEL
BUN: 53 mg/dL — ABNORMAL HIGH (ref 6–23)
CO2: 24 mEq/L (ref 19–32)
CO2: 21 mEq/L (ref 19–32)
Calcium: 8.5 mg/dL (ref 8.4–10.5)
Chloride: 106 mEq/L (ref 96–112)
Chloride: 108 mEq/L (ref 96–112)
Glucose, Bld: 79 mg/dL (ref 70–99)
Glucose, Bld: 76 mg/dL (ref 70–99)
Potassium: 4.8 mEq/L (ref 3.5–5.1)
Potassium: 5.3 mEq/L — ABNORMAL HIGH (ref 3.5–5.1)
Sodium: 140 mEq/L (ref 135–145)
Sodium: 142 mEq/L (ref 135–145)

## 2013-04-18 MED ORDER — CHLORHEXIDINE GLUCONATE 0.12 % MT SOLN
15.0000 mL | Freq: Two times a day (BID) | OROMUCOSAL | Status: DC
  Administered 2013-04-18 – 2013-04-19 (×2): 15 mL via OROMUCOSAL
  Filled 2013-04-18 (×5): qty 15

## 2013-04-18 MED ORDER — BIOTENE DRY MOUTH MT LIQD
15.0000 mL | Freq: Two times a day (BID) | OROMUCOSAL | Status: DC
  Administered 2013-04-19: 15 mL via OROMUCOSAL

## 2013-04-18 MED ORDER — SODIUM CHLORIDE 0.9 % IV BOLUS (SEPSIS)
250.0000 mL | Freq: Once | INTRAVENOUS | Status: AC
  Administered 2013-04-18: 250 mL via INTRAVENOUS

## 2013-04-18 MED ORDER — COLLAGENASE 250 UNIT/GM EX OINT
TOPICAL_OINTMENT | Freq: Every day | CUTANEOUS | Status: DC
  Administered 2013-04-18 – 2013-04-19 (×2): via TOPICAL
  Filled 2013-04-18: qty 30

## 2013-04-18 NOTE — Progress Notes (Signed)
Utilization review completed.  

## 2013-04-18 NOTE — Progress Notes (Signed)
OT Cancellation Note  Patient Details Name: [name] MRN: [id number] DOB: [birth year]   Cancelled Treatment:    Reason Eval/Treat Not Completed: OT screened, no needs identified, will sign off. Per PT note, pt is total assist with ADLs at baseline and mostly bedbound during day (occasionally sits in w/c).  Will sign off at this time.    04/18/2013 Cipriano Mile OTR/L Pager [phone] Office [phone]

## 2013-04-18 NOTE — Consult Note (Signed)
WOC consult Note Reason for Consult: evaluation of multiple pressure ulcers. From home, appears to be bedbound and does not respond to my questions.  She will moan with movements. Contractures noted of the LE. Wound type: Left trochanter- sDTI ( suspected deep tissue injury) Left LE- ulcer unclear etiology, do not think pressure related due to location Gluteal fold - small fissure appears to be healing Lower thoracic spine: sDTI R lateral chest: sDTI R trochanter: Unstageable Pressure ulcer  Pressure Ulcer POA: Yes x 4 see above  Measurement: Left trochanter-4.5cm x 5.0cm x 0 Left LE- 1.5cm x 1.0cm x 0 Gluteal fold - 2.0cm x 0.2cm x 0.2cm Lower thoracic spine: 1.5cm x 1.5cm x 0 R lateral chest: 5.5cm x 4.0cm x0 R trochanter: 5.5cm x 6.0cm x 0.2cm   Wound bed: Left trochanter- dark, maroon tissue with some minimal partial thickness skin slouging Left LE- 100% yellow slough Gluteal fold - pink, moist, almost completely re-epithelialized Lower thoracic spine: dark, maroon tissue, intact R lateral chest: dark, maroon tissue, intact R trochanter: 90% yellow-black soft slough, non fluctuant   Drainage (amount, consistency, odor) minimal serous drainage from the LE wound and from the right trochanter, the right trochanter does have foul odor   Periwound: intact at all sites  Dressing procedure/placement/frequency: Enzymatic debridement ointment for the LE wound and the right trochanter to aid in removal of necrotic tissue after which can reeval for moist wound healing. All other areas can continue with silicone foam for protection and insulation.  Will add air mattress overlay, would suggest if back to home she have LALM (low air loss mattress) due to multiple pressure ulcers and to prevent worsening.  However unless nutrition is optimized these will continue to progress and she may develop more area. Would recommend nutritional evaluation for supplementation for wound healing.   WOC will  follow along with you Armen Pickup RN,CWOCN [phone]

## 2013-04-18 NOTE — Progress Notes (Addendum)
INITIAL NUTRITION ASSESSMENT  DOCUMENTATION CODES Per approved criteria  -Severe malnutrition in the context of chronic illness   INTERVENTIONS: Monitor magnesium, potassium, and phosphorus daily for at least 3 days, MD to replete as needed, as pt is at risk for refeeding syndrome given dx of severe malnutrition.  Recommend Juven 1 packet PO BID, Ensure Complete PO BID and 30 ml Prostat PO BID once diet has advanced. Recommend SLP evaluation prior to initiation of diet to determine safest diet texture and liquid consistency. RD to continue to follow nutrition care plan.  NUTRITION DIAGNOSIS: Inadequate oral intake related to advanced age and acute encephalopathy as evidenced by visible wasting.  Goal: Advance diet as tolerated; within GOC. Intake to meet >90% of estimated nutrition needs.  Monitor:  weight trends, lab trends, I/O's, diet advancement, GOC  Reason for Assessment: Malnutrition Screening Tool + Low Braden Score  79 y.o. female  Admitting Dx: Acute renal failure  ASSESSMENT: PMHx significant for dementia, mostly bedbound, pacemaker, anemia, right hip fracture, UTI, CVA, B12 deficiency. Admitted from PCP office with complaints of poor oral intake, AMS and decreased urine output x 5-6 days.   Per H&P, ? Consideration of home hospice given overall FTT. Palliative care consult pending.  WOC RN saw pt this morning - pt with several sDTIs. Per note, pt's wounds will continue to progress and she may develop more areas with current nutritional status.  Currently NPO. Pt followed by nutrition team during April hospitalization - pt was ordered for Dysphagia 1 diet, thin liquids, with Ensure Complete. Pt was also dx with severe malnutrition given severe body and fat mass loss, this continues at this time. Pt is currently resting, no family at bedside.  Nutrition Focused Physical Exam:  Subcutaneous Fat:  Orbital Region: moderately depleted Upper Arm Region: severely  depleted Thoracic and Lumbar Region: n/a  Muscle:  Temple Region: severely depleted Clavicle Bone Region: severely depleted Clavicle and Acromion Bone Region: severely depleted Scapular Bone Region: n/a Dorsal Hand: n/a Patellar Region: severely depleted Anterior Thigh Region: n/a Posterior Calf Region: n/a  Edema: none    Height: Ht Readings from Last 1 Encounters:  03/14/13 5\' 2"  (1.575 m)    Weight: Wt Readings from Last 1 Encounters:  04/18/13 118 lb (53.524 kg)    Ideal Body Weight: 110 lb  % Ideal Body Weight: 107%  Wt Readings from Last 10 Encounters:  04/18/13 118 lb (53.524 kg)  12/09/12 102 lb 6.4 oz (46.448 kg)    Usual Body Weight: n/a  % Usual Body Weight: n/a  BMI:  Body mass index is 21.58 kg/(m^2).  Estimated Nutritional Needs: Kcal: 1200 - 1400 Protein: 50 - 60 g Fluid: 1.2 - 1.4 liters daily  Skin:  Left trochanter: sDTI  Left LE: ulcer  Gluteal fold: small fissure appears to be healing  Lower thoracic spine: sDTI  R lateral chest: sDTI  R trochanter: Unstageable Pressure ulcer  Diet Order: NPO  EDUCATION NEEDS: -No education needs identified at this time  No intake or output data in the 24 hours ending 04/18/13 0944  Last BM: PTA  Labs:   Recent Labs Lab 04/06/2013 1359 04/11/2013 1730 04/16/2013 2305 04/18/13 0135  NA 134*  --  133* 140  K >7.5*  >7.5* 6.6* >7.5* 4.8  CL 102  --  103 106  CO2 22  --  21 24  BUN 55*  --  53* 53*  CREATININE 2.06*  --  1.93* 1.94*  CALCIUM 8.7  --  8.0* 8.5  GLUCOSE 125*  --  181* 79    CBG (last 3)   Recent Labs  04/19/2013 1425  GLUCAP 128*    Scheduled Meds: . antiseptic oral rinse  15 mL Mouth Rinse q12n4p  . chlorhexidine  15 mL Mouth Rinse BID  . collagenase   Topical Daily  . folic acid  2 mg Oral Daily  . multivitamin with minerals  1 tablet Oral Daily  . sodium chloride  3 mL Intravenous Q12H  . timolol  1 drop Both Eyes Daily    Continuous Infusions: . sodium  chloride 75 mL/hr at 04/18/13 1610    Past Medical History  Diagnosis Date  . Bradycardia     S/p Pacemaker  . Cold agglutinin disease   . Anemia   . DJD (degenerative joint disease)   . Hx of fracture of right hip   . UTI (lower urinary tract infection)   . Glaucoma   . History of CVA (cerebrovascular accident)   . Vitamin B12 deficiency   . 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 Dementia     Past Surgical History  Procedure Laterality Date  . Splenectomy, total      Jarold Motto MS, [place], [place] Pager: [phone] After-hours pager: [phone]

## 2013-04-18 NOTE — Progress Notes (Signed)
Subjective: Non verbal  Objective: Vital signs in last 24 hours: Temp:  [97.3 F (36.3 C)-99.2 F (37.3 C)] 97.4 F (36.3 C) (08/13 0454) Pulse Rate:  [59-108] 65 (08/13 0613) Resp:  [16-24] 18 (08/12 2115) BP: (73-97)/(37-55) 73/37 mmHg (08/13 0613) SpO2:  [96 %-100 %] 96 % (08/12 2115) Weight:  [50.712 kg (111 lb 12.8 oz)-55.027 kg (121 lb 5 oz)] 53.524 kg (118 lb) (08/13 0613) Weight change:     Intake/Output from previous day:   Intake/Output this shift:    General appearance: slowed mentation Resp: clear to auscultation bilaterally Cardio: regular rate and rhythm, S1, S2 normal, no murmur, click, rub or gallop GI: soft, non-tender; bowel sounds normal; no masses,  no organomegaly Extremities: extremities normal, atraumatic, no cyanosis or edema  Lab Results:  Recent Labs  2013/05/07 1359 04/18/13 0130  WBC 14.3* 12.4*  HGB 8.7* 8.0*  HCT 24.9* 15.4*  PLT 125* 123*   BMET  Recent Labs  07-May-2013 2305 04/18/13 0135  NA 133* 140  K >7.5* 4.8  CL 103 106  CO2 21 24  GLUCOSE 181* 79  BUN 53* 53*  CREATININE 1.93* 1.94*  CALCIUM 8.0* 8.5    Studies/Results: Ct Abdomen Pelvis Wo Contrast  05-07-2013   *RADIOLOGY REPORT*  Clinical Data: Generalized weakness  CT ABDOMEN AND PELVIS WITHOUT CONTRAST  Technique:  Multidetector CT imaging of the abdomen and pelvis was performed following the standard protocol without intravenous contrast.  Comparison: None.  Findings: The study is severely limited secondary to lack of oral and intravenous contrast.  Large bilateral pleural effusions.  Bibasilar atelectasis verses airspace disease.  Diffuse fatty edema involving subcutaneous fat and intra-abdominal fat.  Mitral annular calcification.  Aortic valve calcification. Pacemaker leads are noted.  Post cholecystectomy.  The unenhanced liver, spleen, pancreas are grossly within normal limits.  There are cysts in the kidneys.  These cannot be characterized by this study.  Adrenal  glands are ill-defined but grossly unremarkable.  No obvious free intraperitoneal gas.  Small amount of free fluid is layering in the pelvis.  No obvious loculated abscess.  The uterus is lobulated with calcifications compatible with calcified uterine fibroids.  An impacted left femoral neck fracture is present.  It has a chronic to subacute appearance with impaction. Protrusio of the left acetabulum is noted.  Osteopenia.  No vertebral compression deformity.  IMPRESSION: Limited exam as described.  Bilateral pleural effusions and bibasilar atelectasis verses airspace disease.  Diffuse third spacing and edema.  Left femoral neck fracture.  This has a chronic to subacute appearance.  No evidence of bowel obstruction.  No free intraperitoneal gas.  Calcified uterine fibroids.   Original Report Authenticated By: Jolaine Click, M.D.   Dg Chest 2 View  07-May-2013   *RADIOLOGY REPORT*  Clinical Data: Acute mental status changes.  CHEST - 2 VIEW  Comparison: Portable chest x-rays 06/11/2010 dating back to 05/31/2010.  Two-view chest x-ray 10/24/2008.  Findings: Cardiac silhouette moderately to markedly enlarged but stable.  Thoracic aorta tortuous and atherosclerotic, unchanged. Enlarged central right pulmonary artery, increased in size since 2010.  Pulmonary venous hypertension and minimal interstitial pulmonary edema.  Bilateral pleural effusions, right greater than left, and associated consolidation in the lower lobes.  Left subclavian dual lead transvenous pacemaker unchanged and appears intact.  IMPRESSION: Minimal to mild CHF, with stable cardiomegaly and mild diffuse interstitial pulmonary edema.  Bilateral pleural effusions, right greater than left, with associated passive atelectasis and/or pneumonia in the lower lobes.  Original Report Authenticated By: Hulan Saas, M.D.   Ct Head Wo Contrast  05/03/2013   *RADIOLOGY REPORT*  Clinical Data: Altered mental status.  CT HEAD WITHOUT CONTRAST  Technique:   Contiguous axial images were obtained from the base of the skull through the vertex without contrast.  Comparison: 12/06/2012.  Findings: No intracranial hemorrhage.  Small vessel disease type changes without CT evidence of large acute infarct.  Artifact extends through the pons and medulla.  Global atrophy without change.  No intracranial mass lesion detected on this unenhanced exam.  Prominent vascular calcifications.  Mucosal thickening in the right maxillary sinus.  Orbital structures unremarkable.  IMPRESSION: No intracranial hemorrhage.  Small vessel disease type changes without CT evidence of large acute infarct.  Global atrophy without change.  Prominent vascular calcifications.  Mucosal thickening in the right maxillary sinus.   Original Report Authenticated By: Lacy Duverney, M.D.    Medications: I have reviewed the patient's current medications.  Assessment/Plan: Principal Problem:   Acute renal failure likely secondary to overdiuresis and poor po intake, continue IV saline. Active Problems:   Encephalopathy acute no change   Hyperkalemia resolved with appropriate treatment   UTI (lower urinary tract infection) no evidence on UA, stop antibiotics   Anemia, chronic cold agglutinin disease, follow   Failure to thrive   Decubitus ulcer- b/l hip & sacrum, wound care to seet   Thrombocytopenia follow   CHF recent onset, holding lasix for now   Prognosis guarded, consider palliative care consult. NO CODE BLUE   LOS: 1 day    [name] 04/18/2013, 7:16 AM

## 2013-04-18 NOTE — Evaluation (Signed)
Physical Therapy Evaluation Patient Details Name: [name] MRN: [id number] DOB: [birth year] Today's Date: 04/18/2013 Time: 1110-1120 PT Time Calculation (min): 10 min  PT Assessment / Plan / Recommendation History of Present Illness    79 y.o. female with history of dementia, mostly bedbound, pacemaker, cold agglutinin disease, anemia, blood transfusions, right hip fracture, UTI, CVA, B12 deficiency, sent to the ED from PCPs office secondary to poor oral intake, decreased urine output, generalized weakness, altered mental status. History is obtained from patient's 2 daughters at bedside. At baseline, she states mostly in bed and for a couple of hours will be on wheelchair, able to recognize her family members. For the last 5-6 days, the family has noted progressive decline-decreased oral intake, decreased urine output, generalized weakness and excessive sleepiness. No fever, chills, nausea, vomiting, diarrhea, pain, cough or dyspnea. She was seen by her PCP and started on diuretics for leg edema. There was no change in her urine output. Her status continued to decline. She was taken back to the PCPs office today and was sent to the ED for possible dehydration, renal insufficiency and urinary tract infection. In the ED, hypotensive with systolic blood pressure in the 90s, CT head does not show anything acute, UA shows positive nitrites but only 0-2 white blood cells, potassium is 6.6 without EKG changes and creatinine is 2. Hospitalist admission requested.   Clinical Impression  Pt is total care for ADLs and baseline and does not have acute PT needs.  No family at bedside.  Note multiple pressure ulcers on this bedbound patient, recommend HHPT for caregiver support and education to maximize comfort and safety of pt at home, versus discuss with family whether they are capable of continuing to care adequately for patient as she progresses.  PT will sign off and defer to nursing for continued skin  care protocol including frequent repositioning, diligent pericare and other personal care needs in hospital.      PT Assessment  All further PT needs can be met in the next venue of care    Follow Up Recommendations  Home health PT (for caregiver education with bedbound patient)    Does the patient have the potential to tolerate intense rehabilitation      Barriers to Discharge        Equipment Recommendations  None recommended by PT    Recommendations for Other Services     Frequency      Precautions / Restrictions Restrictions Other Position/Activity Restrictions: turn and reposition for optimal skin protection in pt with multiple preexisting decubitii   Pertinent Vitals/Pain Moans when I attempt to assess limb ROM and when SCDs are operating      Mobility  Bed Mobility Bed Mobility: Rolling Right;Rolling Left Rolling Right: 1: +1 Total assist Rolling Left: 1: +1 Total assist Details for Bed Mobility Assistance: attempted to roll with pt, she became vocally agitated and moaning; noted pt on left side with multiple decubitii bandaged and documented on white board in room.  nursing in to reposition  Transfers Transfers: Not assessed Details for Transfer Assistance: recommend using maximove for transfer needs if desired Ambulation/Gait Ambulation/Gait Assistance: Not tested (comment)    Exercises     PT Diagnosis: Altered mental status  PT Problem List: Decreased mobility;Decreased skin integrity PT Treatment Interventions:       PT Goals(Current goals can be found in the care plan section) Acute Rehab PT Goals PT Goal Formulation: No goals set, d/c therapy  Visit Information  Last  PT Received On: 04/18/13 Assistance Needed: +1 Reason Eval/Treat Not Completed: PT screened, no needs identified, will sign off       Prior Functioning  Home Living Family/patient expects to be discharged to:: Private residence Living Arrangements: Children;Other  relatives Available Help at Discharge: Family Type of Home: House Home Access: Stairs to enter;Ramped entrance Entrance Stairs-Number of Steps: 2 Home Layout: One level Home Equipment: Walker - 2 wheels;Wheelchair - manual;Bedside commode Additional Comments: no family present to verify, history based on previous admissions Prior Function Level of Independence: Needs assistance Gait / Transfers Assistance Needed: total ADL's / Homemaking Assistance Needed: total Comments: pt needs 24 total assist at home Communication Communication: Expressive difficulties;Receptive difficulties    Cognition  Cognition Arousal/Alertness: Lethargic Behavior During Therapy: Flat affect;Restless Overall Cognitive Status: History of cognitive impairments - at baseline    Extremity/Trunk Assessment Upper Extremity Assessment Upper Extremity Assessment: Generalized weakness Lower Extremity Assessment Lower Extremity Assessment: Generalized weakness Cervical / Trunk Assessment Cervical / Trunk Assessment: Kyphotic   Balance    End of Session PT - End of Session Patient left: in bed Nurse Communication: Mobility status  GP     Dennis Bast 04/18/2013, 11:27 AM

## 2013-04-19 LAB — COMPREHENSIVE METABOLIC PANEL
ALT: 33 U/L (ref 0–35)
Albumin: 1.8 g/dL — ABNORMAL LOW (ref 3.5–5.2)
Alkaline Phosphatase: 124 U/L — ABNORMAL HIGH (ref 39–117)
BUN: 58 mg/dL — ABNORMAL HIGH (ref 6–23)
Chloride: 110 mEq/L (ref 96–112)
GFR calc Af Amer: 21 mL/min — ABNORMAL LOW (ref >90)
Glucose, Bld: 98 mg/dL (ref 70–99)
Potassium: 4.9 mEq/L (ref 3.5–5.1)
Sodium: 142 mEq/L (ref 135–145)
Total Bilirubin: 2.5 mg/dL — ABNORMAL HIGH (ref 0.3–1.2)
Total Protein: 4.6 g/dL — ABNORMAL LOW (ref 6.0–8.3)

## 2013-04-19 LAB — CBC
HCT: 23.1 % — ABNORMAL LOW (ref 36.0–46.0)
MCHC: 30.3 g/dL (ref 30.0–36.0)
MCV: 114.4 fL — ABNORMAL HIGH (ref 78.0–100.0)
Platelets: 157 10*3/uL (ref 150–400)
RDW: 23.2 % — ABNORMAL HIGH (ref 11.5–15.5)
WBC: 15.7 10*3/uL — ABNORMAL HIGH (ref 4.0–10.5)

## 2013-04-19 LAB — BASIC METABOLIC PANEL
BUN: 53 mg/dL — ABNORMAL HIGH (ref 6–23)
CO2: 21 mEq/L (ref 19–32)
Calcium: 8 mg/dL — ABNORMAL LOW (ref 8.4–10.5)
Creatinine, Ser: 1.93 mg/dL — ABNORMAL HIGH (ref 0.50–1.10)

## 2013-04-19 MED ORDER — SODIUM CHLORIDE 0.9 % IV BOLUS (SEPSIS)
250.0000 mL | Freq: Once | INTRAVENOUS | Status: AC
  Administered 2013-04-19: via INTRAVENOUS

## 2013-04-19 MED ORDER — SODIUM CHLORIDE 0.9 % IV BOLUS (SEPSIS)
500.0000 mL | Freq: Once | INTRAVENOUS | Status: AC
Start: 2013-04-19 — End: 2013-04-19
  Administered 2013-04-19: 06:00:00 via INTRAVENOUS

## 2013-04-19 MED ORDER — MORPHINE SULFATE 2 MG/ML IJ SOLN
2.0000 mg | INTRAMUSCULAR | Status: DC | PRN
  Administered 2013-04-19: 2 mg via INTRAVENOUS
  Administered 2013-04-19: 0.5 mg via INTRAVENOUS
  Filled 2013-04-19 (×2): qty 1

## 2013-04-19 MED ORDER — SODIUM CHLORIDE 0.9 % IV BOLUS (SEPSIS)
250.0000 mL | Freq: Once | INTRAVENOUS | Status: AC
  Administered 2013-04-19: 04:00:00 via INTRAVENOUS

## 2013-04-19 NOTE — Progress Notes (Signed)
Thank you for consulting the Palliative Medicine Team at [facility] to meet your patient's and family's needs.  The reason that you asked [place] to see your patient is for a GOC - Goals of Care; EOL decisions/care We have scheduled your patient for a meeting tomorrow 8/15   @   1pm [facility] The Surrogate decision maker is dtr Fannie Knee [phone] Other family members that need to be present include: another dtr and son Your patient is able to participate. Additional Narrative: adm from home w/dec intake, dec urine output, generalized weakness, AMS, BLE edema, Kcl 6.6, bedbound, dementia  Chalmers Cater RN,  PMT RN Liaison Call PMT phone [phone]

## 2013-04-19 NOTE — Progress Notes (Addendum)
Subjective: Moaning.  "Let me go"  Objective: Vital signs in last 24 hours: Temp:  [97.4 F (36.3 C)-97.8 F (36.6 C)] 97.7 F (36.5 C) (08/14 0400) Pulse Rate:  [60-71] 60 (08/14 0524) Resp:  [18-20] 20 (08/14 0400) BP: (66-99)/(36-57) 71/39 mmHg (08/14 0524) SpO2:  [65 %-100 %] 93 % (08/14 0400) Weight:  [56.019 kg (123 lb 8 oz)] 56.019 kg (123 lb 8 oz) (08/14 0400) Weight change: 0.992 kg (2 lb 3 oz)    Intake/Output from previous day: 08/13 0701 - 08/14 0700 In: 3390.4 [I.V.:3027.9; IV Piggyback:362.5] Out: 100 [Urine:100] Intake/Output this shift: Total I/O In: 1614.2 [I.V.:1251.7; IV Piggyback:362.5] Out: 100 [Urine:100]  General appearance: slowed mentation Resp: clear to auscultation bilaterally Cardio: regular rate and rhythm, S1, S2 normal, no murmur, click, rub or gallop Extremities: extremities normal, atraumatic, no cyanosis or edema  Lab Results:  Recent Labs  04/18/13 0130 04/18/13 0545  WBC 12.4* RESULTS UNAVAILABLE DUE TO INTERFERING SUBSTANCE  HGB 8.0* 8.0*  HCT 15.4* 25.6*  PLT 123* PLATELET CLUMPS NOTED ON SMEAR, UNABLE TO ESTIMATE   BMET  Recent Labs  04/18/13 0135 04/18/13 0910  NA 140 142  K 4.8 5.3*  CL 106 108  CO2 24 21  GLUCOSE 79 76  BUN 53* 53*  CREATININE 1.94* 1.95*  CALCIUM 8.5 8.7    Studies/Results: Ct Abdomen Pelvis Wo Contrast  18-Apr-2013   *RADIOLOGY REPORT*  Clinical Data: Generalized weakness  CT ABDOMEN AND PELVIS WITHOUT CONTRAST  Technique:  Multidetector CT imaging of the abdomen and pelvis was performed following the standard protocol without intravenous contrast.  Comparison: None.  Findings: The study is severely limited secondary to lack of oral and intravenous contrast.  Large bilateral pleural effusions.  Bibasilar atelectasis verses airspace disease.  Diffuse fatty edema involving subcutaneous fat and intra-abdominal fat.  Mitral annular calcification.  Aortic valve calcification. Pacemaker leads are noted.   Post cholecystectomy.  The unenhanced liver, spleen, pancreas are grossly within normal limits.  There are cysts in the kidneys.  These cannot be characterized by this study.  Adrenal glands are ill-defined but grossly unremarkable.  No obvious free intraperitoneal gas.  Small amount of free fluid is layering in the pelvis.  No obvious loculated abscess.  The uterus is lobulated with calcifications compatible with calcified uterine fibroids.  An impacted left femoral neck fracture is present.  It has a chronic to subacute appearance with impaction. Protrusio of the left acetabulum is noted.  Osteopenia.  No vertebral compression deformity.  IMPRESSION: Limited exam as described.  Bilateral pleural effusions and bibasilar atelectasis verses airspace disease.  Diffuse third spacing and edema.  Left femoral neck fracture.  This has a chronic to subacute appearance.  No evidence of bowel obstruction.  No free intraperitoneal gas.  Calcified uterine fibroids.   Original Report Authenticated By: Jolaine Click, M.D.   Dg Chest 2 View  04-18-2013   *RADIOLOGY REPORT*  Clinical Data: Acute mental status changes.  CHEST - 2 VIEW  Comparison: Portable chest x-rays 06/11/2010 dating back to 05/31/2010.  Two-view chest x-ray 10/24/2008.  Findings: Cardiac silhouette moderately to markedly enlarged but stable.  Thoracic aorta tortuous and atherosclerotic, unchanged. Enlarged central right pulmonary artery, increased in size since 2010.  Pulmonary venous hypertension and minimal interstitial pulmonary edema.  Bilateral pleural effusions, right greater than left, and associated consolidation in the lower lobes.  Left subclavian dual lead transvenous pacemaker unchanged and appears intact.  IMPRESSION: Minimal to mild CHF, with stable cardiomegaly  and mild diffuse interstitial pulmonary edema.  Bilateral pleural effusions, right greater than left, with associated passive atelectasis and/or pneumonia in the lower lobes.   Original  Report Authenticated By: Hulan Saas, M.D.   Ct Head Wo Contrast  05/05/2013   *RADIOLOGY REPORT*  Clinical Data: Altered mental status.  CT HEAD WITHOUT CONTRAST  Technique:  Contiguous axial images were obtained from the base of the skull through the vertex without contrast.  Comparison: 12/06/2012.  Findings: No intracranial hemorrhage.  Small vessel disease type changes without CT evidence of large acute infarct.  Artifact extends through the pons and medulla.  Global atrophy without change.  No intracranial mass lesion detected on this unenhanced exam.  Prominent vascular calcifications.  Mucosal thickening in the right maxillary sinus.  Orbital structures unremarkable.  IMPRESSION: No intracranial hemorrhage.  Small vessel disease type changes without CT evidence of large acute infarct.  Global atrophy without change.  Prominent vascular calcifications.  Mucosal thickening in the right maxillary sinus.   Original Report Authenticated By: Lacy Duverney, M.D.    Medications: I have reviewed the patient's current medications.  Assessment/Plan: Principal Problem:  Acute renal failure likely secondary to overdiuresis and poor po intake, continue IV saline, urine output poor.  Active Problems:  Encephalopathy acute no change  Hyperkalemia resolved with appropriate treatment  Anemia, chronic cold agglutinin disease, follow  Failure to thrive  Decubitus ulcer- b/l hip & sacrum  Thrombocytopenia follow  CHF recent onset, holding lasix for now Severe malnutrition in context of a chronic illness Prognosis poor consider palliative care consult. NO CODE BLUE.  Morphine sulfate IV prn for comfort.  Spoke with daughter Fannie Knee.  She understands the prognosis is poor.  She agrees to providing some prn morphine sulfate for comfort.      LOS: 2 days   [facility] 04/19/2013, 6:37 AM

## 2013-04-19 NOTE — Progress Notes (Signed)
Patient blood pressure down 73/56 and has only had 100cc of urine output since foley catheter placed .Call placed to Dr.Osborne.New order received for 250cc normal saline bolus.

## 2013-04-19 NOTE — Progress Notes (Signed)
Patient blood pressure low 76/36.Call placed to Dr.Osborne .New order received 250cc normal saline bolus.

## 2013-04-19 NOTE — Progress Notes (Signed)
Patient blood pressure 71/39 no urine output since foley catheter emptied last night.Call placed to Dr.Osborne .New order received 500cc normal saline bolus over 2 hours.

## 2013-04-30 NOTE — Discharge Summary (Addendum)
Physician Discharge Summary  Patient ID: [name] MRN: [id number] DOB/AGE: 1914/12/11 79 y.o.  Admit date: April 18, 2013 Discharge date: 04/30/2013  Admission Diagnoses: Acute renal failure Acute encephalopathy Hyperkalemia Anemia, cold agglutinin disease Decubitus ulcer Thrombocytopenia Congestive heart failure Severe malnutrition in context of a chronic illness   Discharge Diagnoses:  Principal Problem:   Acute renal failure Active Problems:   Encephalopathy acute   Hyperkalemia   Anemia, chronic cold agglutinin disease   Severe malnutrition in the context of a chronic illness Congestive heart failure   Decubitus ulcer- b/l hip & sacrum   Thrombocytopenia   Discharged Condition: deceased   Hospital Course: the patient was admitted on 04/18/1919 with poor oral intake, decreased urine output, generalized weakness and altered mental status. The patient had developed symptoms of congestive heart failure about 2 weeks prior to admission had been treated with diuretics. In emergency the patient was hypotensive with acute renal failure and hyperkalemia potassium greater than 7.5. This was treated acutely and corrected within 24 hours. Patient started on IV saline. Initially started on antibiotics but no evidence on urinalysis of antibiotics were stopped. The patient's blood pressure remained low. The patient was made no CODE BLUE. By August 14 blood pressure remained low with systolics running between 66 and 99  despite IV normal saline. The family was informed of the patient's poor prognosis, morphine sulfate IV was given when necessary for comfort. Palliative care was consulted and a family conference is scheduled for August 15 at 1 PM. The patient denied early in the morning of August 15.  Consults: None  Significant Diagnostic Studies:   Treatments: IV hydration  Discharge Exam: Blood pressure 52/36, pulse 60, temperature 98.7 F (37.1 C), temperature source Axillary,  resp. rate 22, weight 52.799 kg (116 lb 6.4 oz), SpO2 73.00%.    Disposition: 20-Expired     Medication List    ASK your doctor about these medications       acetaminophen 325 MG tablet  Commonly known as:  TYLENOL  Take 650 mg by mouth every 6 (six) hours as needed for pain (for pain).     cyanocobalamin 1000 MCG tablet  Inject 100 mcg into the muscle every 30 (thirty) days.     folic acid 1 MG tablet  Commonly known as:  FOLVITE  Take 2 mg by mouth daily.     multivitamin tablet  Take 1 tablet by mouth daily.     oxyCODONE-acetaminophen 5-325 MG per tablet  Commonly known as:  PERCOCET/ROXICET  Take 1 tablet by mouth every 8 (eight) hours as needed for pain.     timolol 0.5 % ophthalmic solution  Commonly known as:  TIMOPTIC  Place 1 drop into both eyes daily.         SignedLillia Mountain 04/30/2013, 5:07 AM

## 2013-05-07 NOTE — Progress Notes (Signed)
Chaplain Note: Pt lying on her side groaning on and off. Very large family at bedside and some in waiting area. Family coping well under the circumstances...pt's daughter stated they know its only a matter of time.  Provided ministry of presence as well as emotional and spiritual support. Prayed with family.  Rutherford Nail Chaplain

## 2013-05-07 DEATH — deceased

## 2013-05-16 ENCOUNTER — Other Ambulatory Visit: Payer: PRIVATE HEALTH INSURANCE | Admitting: Lab

## 2013-05-16 ENCOUNTER — Ambulatory Visit: Payer: PRIVATE HEALTH INSURANCE

## 2013-05-16 ENCOUNTER — Ambulatory Visit: Payer: PRIVATE HEALTH INSURANCE | Admitting: Hematology & Oncology

## 2014-04-30 ENCOUNTER — Other Ambulatory Visit: Payer: Self-pay | Admitting: *Deleted
